# Patient Record
Sex: Male | Born: 1975 | Race: Black or African American | Hispanic: No | Marital: Single | Smoking: Former smoker
Health system: Southern US, Community
[De-identification: ages and names within clinical notes are randomized; demographics above are authoritative.]

## PROBLEM LIST (undated history)

## (undated) DIAGNOSIS — B9681 Helicobacter pylori [H. pylori] as the cause of diseases classified elsewhere: Secondary | ICD-10-CM

## (undated) DIAGNOSIS — K269 Duodenal ulcer, unspecified as acute or chronic, without hemorrhage or perforation: Secondary | ICD-10-CM

## (undated) DIAGNOSIS — D573 Sickle-cell trait: Secondary | ICD-10-CM

## (undated) HISTORY — DX: Duodenal ulcer, unspecified as acute or chronic, without hemorrhage or perforation: K26.9

## (undated) HISTORY — DX: Helicobacter pylori (H. pylori) as the cause of diseases classified elsewhere: B96.81

## (undated) HISTORY — PX: NO PAST SURGERIES: SHX2092

---

## 1999-10-02 ENCOUNTER — Emergency Department (HOSPITAL_COMMUNITY): Admission: EM | Admit: 1999-10-02 | Discharge: 1999-10-02 | Payer: Self-pay | Admitting: Emergency Medicine

## 2009-07-10 ENCOUNTER — Emergency Department (HOSPITAL_COMMUNITY): Admission: EM | Admit: 2009-07-10 | Discharge: 2009-07-10 | Payer: Self-pay | Admitting: Family Medicine

## 2011-03-29 ENCOUNTER — Emergency Department (HOSPITAL_COMMUNITY): Payer: Self-pay

## 2011-03-29 ENCOUNTER — Encounter (HOSPITAL_COMMUNITY): Payer: Self-pay | Admitting: Emergency Medicine

## 2011-03-29 ENCOUNTER — Inpatient Hospital Stay (HOSPITAL_COMMUNITY)
Admission: EM | Admit: 2011-03-29 | Discharge: 2011-04-02 | DRG: 379 | Disposition: A | Discharge status: Home / Self Care | Payer: Self-pay | Attending: Internal Medicine | Admitting: Internal Medicine

## 2011-03-29 DIAGNOSIS — K269 Duodenal ulcer, unspecified as acute or chronic, without hemorrhage or perforation: Secondary | ICD-10-CM

## 2011-03-29 DIAGNOSIS — K922 Gastrointestinal hemorrhage, unspecified: Secondary | ICD-10-CM | POA: Diagnosis present

## 2011-03-29 DIAGNOSIS — A048 Other specified bacterial intestinal infections: Secondary | ICD-10-CM | POA: Diagnosis present

## 2011-03-29 DIAGNOSIS — D72829 Elevated white blood cell count, unspecified: Secondary | ICD-10-CM | POA: Diagnosis present

## 2011-03-29 DIAGNOSIS — D500 Iron deficiency anemia secondary to blood loss (chronic): Secondary | ICD-10-CM | POA: Diagnosis present

## 2011-03-29 DIAGNOSIS — D649 Anemia, unspecified: Secondary | ICD-10-CM

## 2011-03-29 DIAGNOSIS — F17200 Nicotine dependence, unspecified, uncomplicated: Secondary | ICD-10-CM | POA: Diagnosis present

## 2011-03-29 DIAGNOSIS — K264 Chronic or unspecified duodenal ulcer with hemorrhage: Principal | ICD-10-CM | POA: Diagnosis present

## 2011-03-29 HISTORY — DX: Sickle-cell trait: D57.3

## 2011-03-29 LAB — COMPREHENSIVE METABOLIC PANEL
ALT: 9 U/L (ref 0–53)
AST: 20 U/L (ref 0–37)
Albumin: 3.5 g/dL (ref 3.5–5.2)
Alkaline Phosphatase: 43 U/L (ref 39–117)
CO2: 25 mEq/L (ref 19–32)
GFR calc non Af Amer: 86 mL/min — ABNORMAL LOW (ref >90)
Glucose, Bld: 111 mg/dL — ABNORMAL HIGH (ref 70–99)
Sodium: 139 mEq/L (ref 135–145)

## 2011-03-29 LAB — URINALYSIS, ROUTINE W REFLEX MICROSCOPIC
Glucose, UA: NEGATIVE mg/dL
Hgb urine dipstick: NEGATIVE
Leukocytes, UA: NEGATIVE
pH: 5.5 (ref 5.0–8.0)

## 2011-03-29 LAB — PROTIME-INR
INR: 1.1 (ref 0.00–1.49)
Prothrombin Time: 14.4 seconds (ref 11.6–15.2)

## 2011-03-29 LAB — DIFFERENTIAL
Basophils Absolute: 0 10*3/uL (ref 0.0–0.1)
Neutrophils Relative %: 79 % — ABNORMAL HIGH (ref 43–77)

## 2011-03-29 LAB — CBC
Hemoglobin: 10 g/dL — ABNORMAL LOW (ref 13.0–17.0)
MCH: 28.5 pg (ref 26.0–34.0)
MCHC: 36.9 g/dL — ABNORMAL HIGH (ref 30.0–36.0)
MCV: 77.2 fL — ABNORMAL LOW (ref 78.0–100.0)
WBC: 17.1 10*3/uL — ABNORMAL HIGH (ref 4.0–10.5)

## 2011-03-29 MED ORDER — IOHEXOL 300 MG/ML  SOLN
100.0000 mL | Freq: Once | INTRAMUSCULAR | Status: AC | PRN
  Administered 2011-03-29: 80 mL via INTRAVENOUS

## 2011-03-29 MED ORDER — SODIUM CHLORIDE 0.9 % IV BOLUS (SEPSIS)
1000.0000 mL | Freq: Once | INTRAVENOUS | Status: AC
  Administered 2011-03-29: 1000 mL via INTRAVENOUS

## 2011-03-29 MED ORDER — SODIUM CHLORIDE 0.9 % IV SOLN
8.0000 mg/h | INTRAVENOUS | Status: DC
  Administered 2011-03-29 – 2011-04-01 (×4): 8 mg/h via INTRAVENOUS
  Filled 2011-03-29 (×12): qty 80

## 2011-03-29 MED ORDER — SODIUM CHLORIDE 0.9 % IV SOLN
80.0000 mg | Freq: Once | INTRAVENOUS | Status: AC
  Administered 2011-03-29: 80 mg via INTRAVENOUS
  Filled 2011-03-29: qty 80

## 2011-03-29 NOTE — ED Provider Notes (Signed)
History     CSN: [id number]  Arrival date & time 03/29/11  1816   First MD Initiated Contact with Patient 03/29/11 1940      Chief Complaint  Patient presents with  . Rectal Bleeding    (Consider location/radiation/quality/duration/timing/severity/associated sxs/prior treatment) Patient is a 36 y.o. male presenting with hematochezia. The history is provided by the patient. No language interpreter was used.  Rectal Bleeding  The current episode started 2 days ago. The onset was gradual. The problem occurs continuously. The problem has been gradually worsening. The pain is moderate. Stool description: melanotic. There was no prior successful therapy. There was no prior unsuccessful therapy. Associated symptoms include abdominal pain and hematemesis. Pertinent negatives include no anorexia, no fever, no diarrhea, no nausea, no vomiting, no chest pain, no headaches and no coughing. The vomiting occurs intermittently. The emesis has an appearance of bright red blood. The vomiting is not associated with pain.    History reviewed. No pertinent past medical history.  History reviewed. No pertinent past surgical history.  History reviewed. No pertinent family history.  History  Substance Use Topics  . Smoking status: Never Smoker   . Smokeless tobacco: Not on file  . Alcohol Use: No      Review of Systems  Constitutional: Negative for fever, activity change, appetite change and fatigue.  HENT: Negative for congestion, sore throat, rhinorrhea, neck pain and neck stiffness.   Respiratory: Negative for cough and shortness of breath.   Cardiovascular: Negative for chest pain and palpitations.  Gastrointestinal: Positive for abdominal pain, blood in stool, hematochezia and hematemesis. Negative for nausea, vomiting, diarrhea, constipation and anorexia.  Genitourinary: Negative for dysuria, urgency, frequency and flank pain.  Musculoskeletal: Negative for myalgias, back pain and  arthralgias.  Neurological: Negative for dizziness, weakness, light-headedness, numbness and headaches.  All other systems reviewed and are negative.    Allergies  Review of patient's allergies indicates no known allergies.  Home Medications  No current outpatient prescriptions on file.  BP 116/70  Pulse 92  Temp(Src) 98.3 F (36.8 C) (Oral)  Resp 16  SpO2 100%  Physical Exam  Nursing note and vitals reviewed. Constitutional: He is oriented to person, place, and time. He appears well-developed and well-nourished. No distress.  HENT:  Head: Normocephalic and atraumatic.  Mouth/Throat: Oropharynx is clear and moist. No oropharyngeal exudate.  Eyes: Conjunctivae and EOM are normal. Pupils are equal, round, and reactive to light.  Neck: Normal range of motion. Neck supple.  Cardiovascular: Normal rate, regular rhythm, normal heart sounds and intact distal pulses.  Exam reveals no gallop and no friction rub.   No murmur heard. Pulmonary/Chest: Effort normal and breath sounds normal. No respiratory distress.  Abdominal: Soft. Bowel sounds are normal. There is no tenderness.  Genitourinary: Guaiac positive stool.  Musculoskeletal: Normal range of motion. He exhibits no tenderness.  Neurological: He is alert and oriented to person, place, and time. No cranial nerve deficit.  Skin: Skin is warm and dry. No rash noted.    ED Course  Procedures (including critical care time)  Labs Reviewed  CBC - Abnormal; Notable for the following:    WBC 17.1 (*) REPEATED TO VERIFY   RBC 3.51 (*)    Hemoglobin 10.0 (*)    HCT 27.1 (*)    MCV 77.2 (*)    MCHC 36.9 (*)    All other components within normal limits  DIFFERENTIAL - Abnormal; Notable for the following:    Neutrophils Relative 79 (*)  Neutro Abs 13.4 (*)    All other components within normal limits  COMPREHENSIVE METABOLIC PANEL - Abnormal; Notable for the following:    Glucose, Bld 111 (*)    BUN 27 (*)    Total Bilirubin  0.2 (*)    GFR calc non Af Amer 86 (*)    All other components within normal limits  URINALYSIS, ROUTINE W REFLEX MICROSCOPIC - Abnormal; Notable for the following:    Ketones, ur TRACE (*)    All other components within normal limits  APTT  PROTIME-INR   Ct Abdomen Pelvis W Contrast  03/29/2011  *RADIOLOGY REPORT*  Clinical Data: Rectal bleeding.  CT ABDOMEN AND PELVIS WITH CONTRAST  Technique:  Multidetector CT imaging of the abdomen and pelvis was performed following the standard protocol during bolus administration of intravenous contrast.  Contrast: 80mL OMNIPAQUE IOHEXOL 300 MG/ML IV SOLN  Comparison: None.  Findings: Limited images through the lung bases demonstrate no significant appreciable abnormality. The heart size is within normal limits. No pleural or pericardial effusion.  Unremarkable liver, biliary system, spleen, pancreas, adrenal glands.  Symmetric renal enhancement.  No hydronephrosis or hydroureter.  No bowel obstruction.  No CT evidence for colitis.  Normal appendix.  No free intraperitoneal air or fluid.  No lymphadenopathy.  Thin-walled bladder.  Normal caliber vasculature.  No acute osseous abnormality identified.  IMPRESSION: No acute CT abnormality identified.  Original Report Authenticated By: Waneta Martins, M.D.     1. GI bleed       MDM  GI bleed likely upper. Placed on a Protonix drip. CT is negative. He does have a white count of 17. Patient is a hemoglobin of 10 therefore feel this is a rather significant GI bleed. He vomited blood as well. He will be admitted to the hospital for further evaluation and treatment. He'll require GI evaluation. Is on a telemetry service. Discussed with the [facility] hospitalist        Dayton Bailiff, MD 03/29/11 [phone]

## 2011-03-29 NOTE — ED Notes (Signed)
Pt. Not in pain now.  He reports sometimes he will have abdominal pain after eating.

## 2011-03-29 NOTE — ED Notes (Signed)
Pt. Had tarry stools three days ago, then developed had bloody emesis today.  Also has mild upper abdominal pain.

## 2011-03-30 ENCOUNTER — Encounter (HOSPITAL_COMMUNITY): Payer: Self-pay | Admitting: Internal Medicine

## 2011-03-30 ENCOUNTER — Other Ambulatory Visit: Payer: Self-pay | Admitting: Internal Medicine

## 2011-03-30 ENCOUNTER — Encounter (HOSPITAL_COMMUNITY)
Admission: EM | Disposition: A | Discharge status: Home / Self Care | Payer: Self-pay | Source: Home / Self Care | Attending: Internal Medicine

## 2011-03-30 DIAGNOSIS — K922 Gastrointestinal hemorrhage, unspecified: Secondary | ICD-10-CM

## 2011-03-30 DIAGNOSIS — D72829 Elevated white blood cell count, unspecified: Secondary | ICD-10-CM | POA: Diagnosis present

## 2011-03-30 DIAGNOSIS — D649 Anemia, unspecified: Secondary | ICD-10-CM

## 2011-03-30 DIAGNOSIS — K269 Duodenal ulcer, unspecified as acute or chronic, without hemorrhage or perforation: Secondary | ICD-10-CM

## 2011-03-30 HISTORY — PX: ESOPHAGOGASTRODUODENOSCOPY: SHX5428

## 2011-03-30 LAB — CBC
HCT: 23.1 % — ABNORMAL LOW (ref 39.0–52.0)
HCT: 22.7 % — ABNORMAL LOW (ref 39.0–52.0)
Hemoglobin: 8.6 g/dL — ABNORMAL LOW (ref 13.0–17.0)
Hemoglobin: 8.4 g/dL — ABNORMAL LOW (ref 13.0–17.0)
Hemoglobin: 8.1 g/dL — ABNORMAL LOW (ref 13.0–17.0)
MCH: 28.8 pg (ref 26.0–34.0)
MCH: 29.2 pg (ref 26.0–34.0)
MCHC: 37 g/dL — ABNORMAL HIGH (ref 30.0–36.0)
MCHC: 37.3 g/dL — ABNORMAL HIGH (ref 30.0–36.0)
MCV: 77.3 fL — ABNORMAL LOW (ref 78.0–100.0)
MCV: 77.5 fL — ABNORMAL LOW (ref 78.0–100.0)
MCV: 78.3 fL (ref 78.0–100.0)
Platelets: 231 10*3/uL (ref 150–400)
Platelets: 210 10*3/uL (ref 150–400)
RBC: 2.99 MIL/uL — ABNORMAL LOW (ref 4.22–5.81)
RBC: 2.77 MIL/uL — ABNORMAL LOW (ref 4.22–5.81)
RDW: 13.5 % (ref 11.5–15.5)
WBC: 10.1 10*3/uL (ref 4.0–10.5)

## 2011-03-30 LAB — DIFFERENTIAL
Basophils Absolute: 0.1 10*3/uL (ref 0.0–0.1)
Eosinophils Absolute: 0.2 10*3/uL (ref 0.0–0.7)
Lymphs Abs: 3.1 10*3/uL (ref 0.7–4.0)
Neutro Abs: 5.9 10*3/uL (ref 1.7–7.7)

## 2011-03-30 LAB — FERRITIN: Ferritin: 68 ng/mL (ref 22–322)

## 2011-03-30 LAB — RETICULOCYTES
RBC.: 2.99 MIL/uL — ABNORMAL LOW (ref 4.22–5.81)
Retic Count, Absolute: 119.6 10*3/uL (ref 19.0–186.0)
Retic Ct Pct: 4 % — ABNORMAL HIGH (ref 0.4–3.1)

## 2011-03-30 LAB — IRON AND TIBC: UIBC: 113 ug/dL — ABNORMAL LOW (ref 125–400)

## 2011-03-30 SURGERY — EGD (ESOPHAGOGASTRODUODENOSCOPY)
Anesthesia: Moderate Sedation

## 2011-03-30 MED ORDER — ACETAMINOPHEN 650 MG RE SUPP: 650.0000 mg | Freq: Four times a day (QID) | RECTAL | Status: DC | PRN

## 2011-03-30 MED ORDER — SODIUM CHLORIDE 0.9 % IV SOLN
INTRAVENOUS | Status: AC
  Administered 2011-03-30 (×2): via INTRAVENOUS

## 2011-03-30 MED ORDER — EPINEPHRINE HCL 0.1 MG/ML IJ SOLN
INTRAMUSCULAR | Status: AC
Start: 2011-03-30 — End: 2011-03-30
  Filled 2011-03-30: qty 10

## 2011-03-30 MED ORDER — ONDANSETRON HCL 4 MG PO TABS: 4.0000 mg | ORAL_TABLET | Freq: Four times a day (QID) | ORAL | Status: DC | PRN

## 2011-03-30 MED ORDER — SODIUM CHLORIDE 0.9 % IV BOLUS (SEPSIS)
500.0000 mL | Freq: Once | INTRAVENOUS | Status: AC
  Administered 2011-03-30: 500 mL via INTRAVENOUS

## 2011-03-30 MED ORDER — SODIUM CHLORIDE 0.9 % IJ SOLN
3.0000 mL | Freq: Two times a day (BID) | INTRAMUSCULAR | Status: DC
  Administered 2011-04-01 – 2011-04-02 (×2): 3 mL via INTRAVENOUS

## 2011-03-30 MED ORDER — MIDAZOLAM HCL 10 MG/2ML IJ SOLN
INTRAMUSCULAR | Status: DC | PRN
  Administered 2011-03-30 (×2): 2 mg via INTRAVENOUS
  Administered 2011-03-30 (×4): 1 mg via INTRAVENOUS
  Administered 2011-03-30: 2 mg via INTRAVENOUS

## 2011-03-30 MED ORDER — ONDANSETRON HCL 4 MG/2ML IJ SOLN
4.0000 mg | Freq: Three times a day (TID) | INTRAMUSCULAR | Status: DC | PRN
  Administered 2011-03-30: 4 mg via INTRAVENOUS
  Filled 2011-03-30: qty 2

## 2011-03-30 MED ORDER — FENTANYL NICU IV SYRINGE 50 MCG/ML
INJECTION | INTRAMUSCULAR | Status: DC | PRN
  Administered 2011-03-30 (×4): 25 ug via INTRAVENOUS

## 2011-03-30 MED ORDER — ONDANSETRON HCL 4 MG/2ML IJ SOLN: 4.0000 mg | Freq: Four times a day (QID) | INTRAMUSCULAR | Status: DC | PRN

## 2011-03-30 MED ORDER — SODIUM CHLORIDE 0.9 % IJ SOLN
INTRAMUSCULAR | Status: DC | PRN
  Administered 2011-03-30: 17:00:00

## 2011-03-30 MED ORDER — ACETAMINOPHEN 325 MG PO TABS
650.0000 mg | ORAL_TABLET | Freq: Four times a day (QID) | ORAL | Status: DC | PRN
  Administered 2011-03-31 – 2011-04-01 (×2): 650 mg via ORAL
  Filled 2011-03-30 (×2): qty 2

## 2011-03-30 MED ORDER — SODIUM CHLORIDE 0.9 % IV SOLN
INTRAVENOUS | Status: DC
Start: 2011-03-30 — End: 2011-04-01

## 2011-03-30 MED ORDER — SODIUM CHLORIDE 0.45 % IV SOLN: Freq: Once | INTRAVENOUS | Status: DC

## 2011-03-30 NOTE — Progress Notes (Signed)
Patient briefly seen and examined, chart reviewed while he was in the ED earlier. 36 y/o young man Admitted earlier this morning here with hematemesis and maroon-colored stools, with significant Hb drop and orthostatic symptoms. On PPI drip, given a fluid bolus by GI and will be taken to EGD later today. Will continue to follow.

## 2011-03-30 NOTE — Progress Notes (Signed)
ED CM noted pt without pcp and [place] self pay.  CM spoke with pt who confirms self pay, no pcp and living with his grandmother. Pt reports not having a personal telephone.  [facility]ed [facility] and [facility].  Pt voiced understanding Provided written information for resources and other [place] resources on housing, financial assistance, discounted medications, etc.  Pt reports not being on medicines or having a Insurance claims handler.

## 2011-03-30 NOTE — Op Note (Signed)
[facility] [address] [place], [place]  [address]  ENDOSCOPY PROCEDURE REPORT  PATIENT:  David Galloway, David Galloway  MR#:  [id number] BIRTHDATE:  [birth year], 35 yrs. old  GENDER:  male ENDOSCOPIST:  Carie Caddy. Ayren Zumbro, MD Referred by:  [facility] PROCEDURE DATE:  03/30/2011 PROCEDURE:  EGD for control of bleeding ASA CLASS:  Class I INDICATIONS:  melena, hematemesis, anemia MEDICATIONS:   Fentanyl 100 mcg IV, Versed 10 mg IV TOPICAL ANESTHETIC:  Cetacaine Spray  DESCRIPTION OF PROCEDURE:   After the risks benefits and alternatives of the procedure were thoroughly explained, informed consent was obtained.  The Pentax Gastroscope [id number] endoscope was introduced through the mouth and advanced to the second portion of the duodenum, without limitations.  The instrument was slowly withdrawn as the mucosa was fully examined. <<PROCEDUREIMAGES>>  Slightly irregular Z-line.  Otherwise normal esophagus.  Mild gastritis was found antrum.  Multiple biopsies were obtained and sent to pathology.  Two ulcers were found in the bulb of the duodenum.  One was cleaned based, the other with small visible vessel.   Epinephrine injection was performed at the ulcer edge with 2 cc of 1:10,000 epinephrine with success.  One endoscopic clip was placed at the visible vessel with success.  There was mild duodenitis in the bulb with the remaining examined portions of the duodenum being unremarkable. Retroflexed views revealed no abnormalities.    The scope was then withdrawn from the patient and the procedure completed.  COMPLICATIONS:  None  ENDOSCOPIC IMPRESSION: 1) Slightly Irregular Z-line 2) Otherwise normal esophagus 3) Mild gastritis in the antrum.  Biopsies performed to evaluate for H. Pylori 4) Ulcers, two, in the bulb of duodenum.  One with visible vessel, injected and clipped.  RECOMMENDATIONS: 1) Continue PPI infusion for now (given endotherapy performed) 2) Await pathology results.   Treat with triple therapy if positive for H. Pylori. 3) Avoid NSAIDs 4) Follow Hgb/HCT and transfuse if needed.  Carie Caddy. Marylon Verno, MD  CC:  The Patient  n. eSIGNED:   Carie Caddy. Jden Want at 03/30/2011 05:17 PM  Candie Echevaria, [phone]

## 2011-03-30 NOTE — Consult Note (Signed)
Patient seen, examined and I agree with the above documentation, including the assessment and plan. Acute GI bleed with anemia, in setting of known sickle cell trait Still with orthostatic symptoms. Plan IV bolus, 1-2 L for resuscitation EGD later today, on PPI gtt.

## 2011-03-30 NOTE — H&P (Signed)
PCP:   No primary provider on file.   Confirmed with pt, doesn't have PCP   Chief Complaint:  Dark marroon stools and then dark marroon vomit  HPI: 35yoM with no major medical history presents with several days of marroon stools and marroon  vomitus today and found to have acute anemia, leukocytosis -- suspicious for UGIB.   Pt is good historian, states he was in usual state of health until this past Friday when he had  a BM that he noted was very dark, described as dark burgundy, without pain, not completely  solid but not completely liquid either. No other symptoms at that time. He thought it was odd  but tried to wait it out. Saturday, he noted another of these BM's which was also dark  burgundy. Saturday he started having increased dizziness and lightheadedness when he stands up,  that improves if he keeps still by laying down. On Monday, while walking home and after having  eaten a buffalo wing, he got nauseous and vomited when he got home and noted his vomitus was  also dark burgundy, the same color as his BM's before (?). No LOC, no chest pain, SOB.   He also endorses occasional, off/on, 5 year history of abdominal pain, possibly in the  epigastrium but also in the lower quadrants and to the sides, but this history is a bit less  specific. He thought this pain was due to smoking, and he'll try to quit smoking which will  make it feel better. He also thinks this pain may get worse with spicy foods. He can get up to  2ppd. He doesn't drink heavily, states only x1/month. Denies daily NSAID's. Never had  EGD/colonoscopy.   In the ED pt vitals were stable except minimal hypoTN to 99/51 (while sleeping), rest of vitals  stable. Labs with renal fxn 27/1.08, normal LFT's. WBC was 17.1 with 79% neutros. Hct was 27.1  with MCV 77, plts 245. INR was 1.1. UA was negative. CT abd/pelvis with contrast was done,  which showed nothing. Heme occult was positive in the ED. Pt was given 1L NS  bolus, 80mg   Protonix bolus then 8mg /hr drip, zofran, NS infusion.   ROS as above, otherwise negative or unremarkable. No fevers, chills, sweats. No dysuria or  cough. No chest pain, SOB, LOC.   History reviewed. No pertinent past medical history.  He's healthy overall. Tobacco abuse.   History reviewed. No pertinent past surgical history.  Medications:  HOME MEDS: Confirmed, no daily meds.  Prior to Admission medications   Not on File    Allergies:  No Known Allergies  Social History: Lives with grandmother. Unloads trucks, Conservation officer, nature, works in [employer] at [employer]. Still quite active. Current smoker, off and on but overall can get up to 2ppd at times. Denies heavy or daily drinking, states only x1 per month. Denies other drugs.   Family History: Family History  Problem Relation Age of Onset  . Diabetes Father     Physical Exam: Filed Vitals:   03/29/11 1846 03/29/11 2005 03/29/11 2130 03/30/11 0231  BP: 111/67 113/62 116/70 99/51  Pulse: 92 89 92 75  Temp: 98.3 F (36.8 C)   98 F (36.7 C)  TempSrc: Oral   Oral  Resp:  16  16  SpO2: 100% 100% 100% 98%   Blood pressure 99/51, pulse 75, temperature 98 F (36.7 C), temperature source Oral, resp. rate 16, SpO2 98.00%.  Gen: Thin, young appearing M in ED stretcher,  easily awoken and relates history well, no  distress. Pleasant HEENT: Pupils round, reactive, EOMI, sclera/irises clear. Mouth moist, normal appearing.  Lungs: CTAB no w/c/r, good air movement, normal exam Heart: RRR, not tachycardic, no m/g, normal exam Abd: Soft, scaphoid, some mild guarding, with minimal facial grimacing and subjective TTP in  suprapubic area. Overall, benign exam, not rigid or peritoneal Extrem: Warm, perfusing well, palpable radials, no BLE edema Neuro: Alert, attentive, CN 2-12 intact, moves extremities on his own, normal exam overall,  grossly non-focal   Labs & Imaging Results for orders placed during the hospital encounter of  03/29/11 (from the past 48 hour(s))  CBC     Status: Abnormal   Collection Time   03/29/11  8:30 PM      Component Value Range Comment   WBC 17.1 (*) 4.0 - 10.5 (K/uL) REPEATED TO VERIFY   RBC 3.51 (*) 4.22 - 5.81 (MIL/uL)    Hemoglobin 10.0 (*) 13.0 - 17.0 (g/dL)    HCT 16.1 (*) 09.6 - 52.0 (%)    MCV 77.2 (*) 78.0 - 100.0 (fL)    MCH 28.5  26.0 - 34.0 (pg)    MCHC 36.9 (*) 30.0 - 36.0 (g/dL)    RDW 04.5  40.9 - 81.1 (%)    Platelets 245  150 - 400 (K/uL)   DIFFERENTIAL     Status: Abnormal   Collection Time   03/29/11  8:30 PM      Component Value Range Comment   Neutrophils Relative 79 (*) 43 - 77 (%)    Lymphocytes Relative 15  12 - 46 (%)    Monocytes Relative 5  3 - 12 (%)    Eosinophils Relative 1  0 - 5 (%)    Basophils Relative 0  0 - 1 (%)    Neutro Abs 13.4 (*) 1.7 - 7.7 (K/uL)    Lymphs Abs 2.6  0.7 - 4.0 (K/uL)    Monocytes Absolute 0.9  0.1 - 1.0 (K/uL)    Eosinophils Absolute 0.2  0.0 - 0.7 (K/uL)    Basophils Absolute 0.0  0.0 - 0.1 (K/uL)    RBC Morphology POLYCHROMASIA PRESENT     COMPREHENSIVE METABOLIC PANEL     Status: Abnormal   Collection Time   03/29/11  8:30 PM      Component Value Range Comment   Sodium 139  135 - 145 (mEq/L)    Potassium 4.4  3.5 - 5.1 (mEq/L)    Chloride 105  96 - 112 (mEq/L)    CO2 25  19 - 32 (mEq/L)    Glucose, Bld 111 (*) 70 - 99 (mg/dL)    BUN 27 (*) 6 - 23 (mg/dL)    Creatinine, Ser 9.14  0.50 - 1.35 (mg/dL)    Calcium 8.5  8.4 - 10.5 (mg/dL)    Total Protein 6.1  6.0 - 8.3 (g/dL)    Albumin 3.5  3.5 - 5.2 (g/dL)    AST 20  0 - 37 (U/L)    ALT 9  0 - 53 (U/L)    Alkaline Phosphatase 43  39 - 117 (U/L)    Total Bilirubin 0.2 (*) 0.3 - 1.2 (mg/dL)    GFR calc non Af Amer 86 (*) >90 (mL/min)    GFR calc Af Amer >90  >90 (mL/min)   APTT     Status: Normal   Collection Time   03/29/11  8:30 PM      Component Value Range Comment  aPTT 25  24 - 37 (seconds)   PROTIME-INR     Status: Normal   Collection Time   03/29/11  8:30 PM       Component Value Range Comment   Prothrombin Time 14.4  11.6 - 15.2 (seconds)    INR 1.10  0.00 - 1.49    URINALYSIS, ROUTINE W REFLEX MICROSCOPIC     Status: Abnormal   Collection Time   03/29/11  8:47 PM      Component Value Range Comment   Color, Urine YELLOW  YELLOW     APPearance CLEAR  CLEAR     Specific Gravity, Urine 1.025  1.005 - 1.030     pH 5.5  5.0 - 8.0     Glucose, UA NEGATIVE  NEGATIVE (mg/dL)    Hgb urine dipstick NEGATIVE  NEGATIVE     Bilirubin Urine NEGATIVE  NEGATIVE     Ketones, ur TRACE (*) NEGATIVE (mg/dL)    Protein, ur NEGATIVE  NEGATIVE (mg/dL)    Urobilinogen, UA 0.2  0.0 - 1.0 (mg/dL)    Nitrite NEGATIVE  NEGATIVE     Leukocytes, UA NEGATIVE  NEGATIVE  MICROSCOPIC NOT DONE ON URINES WITH NEGATIVE PROTEIN, BLOOD, LEUKOCYTES, NITRITE, OR GLUCOSE <1000 mg/dL.   Ct Abdomen Pelvis W Contrast  03/29/2011  *RADIOLOGY REPORT*  Clinical Data: Rectal bleeding.  CT ABDOMEN AND PELVIS WITH CONTRAST  Technique:  Multidetector CT imaging of the abdomen and pelvis was performed following the standard protocol during bolus administration of intravenous contrast.  Contrast: 80mL OMNIPAQUE IOHEXOL 300 MG/ML IV SOLN  Comparison: None.  Findings: Limited images through the lung bases demonstrate no significant appreciable abnormality. The heart size is within normal limits. No pleural or pericardial effusion.  Unremarkable liver, biliary system, spleen, pancreas, adrenal glands.  Symmetric renal enhancement.  No hydronephrosis or hydroureter.  No bowel obstruction.  No CT evidence for colitis.  Normal appendix.  No free intraperitoneal air or fluid.  No lymphadenopathy.  Thin-walled bladder.  Normal caliber vasculature.  No acute osseous abnormality identified.  IMPRESSION: No acute CT abnormality identified.  Original Report Authenticated By: Waneta Martins, M.D.    Impression Present on Admission:  .Upper GI bleeding .Leukocytosis  35yoM with no major medical history  presents with several days of marroon stools and marroon  vomitus today and found to have acute anemia, leukocytosis -- suspicious for UGIB.   1. Suspected upper GIB: no BRBPR to suggest lower; CTAP negative. Suspect he has had low grade  PUD for awhile now that has now come to a head with symptomatic anemia. Main risk factors is  heavy smoking. BUN elevation also consistent with UGIB.   - Overall looks stable for floor with telemetry monitoring.  - Needs GI consultation for consideration of EGD, keep NPO, IVF's, Hpylori Ab, continue  protonix drip for now. Orthostatics on admit.  - I discussed with pt risks/benefits of transfusion and will hold on transfusion for now in  favor of continued monitoring. I did offer him a transfusion given he's symptomatic on  standing, but he declined at present.  - Counseled re: smoking cessation.   2. Anemia: Obviously likely due to GIB, but given MCV 77 will also get iron panel.   3. Leukocytosis: Has negative UA. No CXR, but he doesn't have any clinical story for PNA, or  really any other infectious source by ROS. Therefore, overall suspect this is stress reaction  due to above and will just monitor.  Telemetry, [facility] team 2 Presumed full code   Other plans as per orders.  Atiana Levier 03/30/2011, 3:52 AM

## 2011-03-30 NOTE — Consult Note (Signed)
[facility] Consultation  Referring Provider: [facility] Primary Care Physician:  No primary provider on file. Primary Gastroenterologist:   none Reason for Consultation:  GI bleed  HPI: David Galloway is a 36 y.o. male who began having black stool 4 days ago. Last bowel movement was Sunday but then yesterday patient began vomiting dark red blood. No significant abdominal pain other than LUQ  "hunger type pain". No NSAID use. Doesn't take any meds at home. WBC 17 initially, now at 10.1. Hemoglobin 10.0 initially, now 8.6. MCV 77. Patient gives a history of sickle cell trait. Differential shows target cells, polychromasia. Contrast CTscan of abd / pelvis negative. He has a great appetite, never gains weight but walks a lot. No history of PUD. No gastric or colon cancer in family. No family history of liver disease. He drinks ETOH about every 3 months.  PMH:  Sickle cell trait.  History reviewed. No pertinent past surgical history.  Prior to Admission medications   Not on File    Current Facility-Administered Medications  Medication Dose Route Frequency Provider Last Rate Last Dose  . 0.9 %  sodium chloride infusion   Intravenous STAT Dayton Bailiff, MD 125 mL/hr at 03/30/11 0232    . 0.9 %  sodium chloride infusion   Intravenous Continuous Carlota Raspberry, MD      . iohexol (OMNIPAQUE) 300 MG/ML solution 100 mL  100 mL Intravenous Once PRN Medication Radiologist, MD   80 mL at 03/29/11 2308  . ondansetron (ZOFRAN) injection 4 mg  4 mg Intravenous Q8H PRN Dayton Bailiff, MD   4 mg at 03/30/11 0232  . pantoprazole (PROTONIX) 80 mg in sodium chloride 0.9 % 100 mL IVPB  80 mg Intravenous Once Dayton Bailiff, MD   80 mg at 03/29/11 2058  . pantoprazole (PROTONIX) 80 mg in sodium chloride 0.9 % 250 mL infusion  8 mg/hr Intravenous Continuous Dayton Bailiff, MD 25 mL/hr at 03/29/11 2146 8 mg/hr at 03/29/11 2146  . sodium chloride 0.9 % bolus 1,000 mL  1,000 mL Intravenous Once Dayton Bailiff, MD    1,000 mL at 03/29/11 2058   No current outpatient prescriptions on file.    Allergies as of 03/29/2011  . (No Known Allergies)    Family History  Problem Relation Age of Onset  . Diabetes Father     History   Social History  . Marital Status: Single    Spouse Name: N/A    Number of Children: N/A  . Years of Education: N/A   Occupational History  . Not on file.   Social History Main Topics  . Smoking status: Current Everyday Smoker -- 1.0 packs/day  . Smokeless tobacco: Not on file   Comment: Up to 2 ppd at times.   . Alcohol Use: Yes     Occasional only, only x1 per month  . Drug Use: No  . Sexually Active: Not on file    Social History Narrative   Lives with grandmother. Unloads trucks, Conservation officer, nature, works in [employer] at [employer]. Still quite active. Current smoker, off and on but overall can get up to 2ppd at times. Denies heavy or daily drinking, states only x1 per month. Denies other drugs.     Review of Systems: Positive for slight dizziness with standing. All other systems reviewed and negative except where noted in HPI.  PHYSICAL EXAM: Vital signs in last 24 hours: Temp:  [97.3 F (36.3 C)-98.3 F (36.8 C)] 97.3 F (36.3 C) (02/05 0507) Pulse  Rate:  [75-116] 78  (02/05 0507) Resp:  [16] 16  (02/05 0507) BP: (99-118)/(51-70) 103/63 mmHg (02/05 0507) SpO2:  [98 %-100 %] 99 % (02/05 0507)   General:   Thin black male in NAD Head:  Normocephalic and atraumatic. Eyes:   No icterus.   Conjunctiva pale. Ears:  Normal auditory acuity. Neck:  Supple; no masses felt Lungs:  Respirations even and unlabored. Lungs clear to auscultation bilaterally.   No wheezes, crackles, or rhonchi.  Heart:  Regular rate and rhythm. Abdomen:  Soft, nondistended, nontender. Normal bowel sounds. No appreciable masses or hepatomegaly.  Rectal:  Scant melenic stool in vault  Msk:  Symmetrical without gross deformities.  Extremities:  Without edema. Neurologic:  Alert and oriented,  grossly normal neurologically. Skin:  Intact without significant lesions or rashes. Cervical Nodes:  No significant cervical adenopathy. Psych:  Alert and cooperative. Normal affect.  LAB RESULTS:  Basename 03/30/11 0446 03/29/11 2030  WBC 10.1 17.1*  HGB 8.6* 10.0*  HCT 23.1* 27.1*  PLT 231 245   BMET  Basename 03/29/11 2030  NA 139  K 4.4  CL 105  CO2 25  GLUCOSE 111*  BUN 27*  CREATININE 1.08  CALCIUM 8.5   LFT  Basename 03/29/11 2030  PROT 6.1  ALBUMIN 3.5  AST 20  ALT 9  ALKPHOS 43  BILITOT 0.2*  BILIDIR --  IBILI --   PT/INR  Basename 03/29/11 2030  LABPROT 14.4  INR 1.10    STUDIES: Ct Abdomen Pelvis W Contrast  03/29/2011  *RADIOLOGY REPORT*  Clinical Data: Rectal bleeding.  CT ABDOMEN AND PELVIS WITH CONTRAST  Technique:  Multidetector CT imaging of the abdomen and pelvis was performed following the standard protocol during bolus administration of intravenous contrast.  Contrast: 80mL OMNIPAQUE IOHEXOL 300 MG/ML IV SOLN  Comparison: None.  Findings: Limited images through the lung bases demonstrate no significant appreciable abnormality. The heart size is within normal limits. No pleural or pericardial effusion.  Unremarkable liver, biliary system, spleen, pancreas, adrenal glands.  Symmetric renal enhancement.  No hydronephrosis or hydroureter.  No bowel obstruction.  No CT evidence for colitis.  Normal appendix.  No free intraperitoneal air or fluid.  No lymphadenopathy.  Thin-walled bladder.  Normal caliber vasculature.  No acute osseous abnormality identified.  IMPRESSION: No acute CT abnormality identified.  Original Report Authenticated By: Waneta Martins, M.D.     PREVIOUS ENDOSCOPIES: none  IMPRESSION / PLAN: 34. 36 year old black male with upper GI bleed evidenced by hematemesis and melena. Contrast CTscan of abdomen and pelvis unremarkable. Continue PPI drip, NPO. Will plan for EGD this afternoon. Patient is hemodynamically stable but feels  dizzy when standing. He needs to be on bedrest, will give him a 500cc fluid bolus now.  2. Anemia of acute blood loss. Hemoglobin 8.6, down from initial hemoglobin of 10.0 .  Baseline not known but patient does give a history of sickle cell trait  His MCV is low at 77, target cells and polychromasia on differential.     Thanks   LOS: 1 day   Willette Cluster  03/30/2011, 10:25 AM

## 2011-03-31 ENCOUNTER — Encounter (HOSPITAL_COMMUNITY): Payer: Self-pay | Admitting: Internal Medicine

## 2011-03-31 LAB — CBC
HCT: 20.3 % — ABNORMAL LOW (ref 39.0–52.0)
MCHC: 36.7 g/dL — ABNORMAL HIGH (ref 30.0–36.0)
MCV: 78.1 fL (ref 78.0–100.0)
Platelets: 185 10*3/uL (ref 150–400)
RDW: 13.8 % (ref 11.5–15.5)
WBC: 7.1 10*3/uL (ref 4.0–10.5)

## 2011-03-31 LAB — BASIC METABOLIC PANEL
Calcium: 7.7 mg/dL — ABNORMAL LOW (ref 8.4–10.5)
Creatinine, Ser: 1.05 mg/dL (ref 0.50–1.35)
GFR calc Af Amer: >90 mL/min (ref >90)
GFR calc non Af Amer: 89 mL/min — ABNORMAL LOW (ref >90)
Sodium: 140 mEq/L (ref 135–145)

## 2011-03-31 LAB — ABO/RH: ABO/RH(D): B POS

## 2011-03-31 LAB — PREPARE RBC (CROSSMATCH)

## 2011-03-31 MED FILL — Diphenhydramine HCl Inj 50 MG/ML: INTRAMUSCULAR | Qty: 1 | Status: AC

## 2011-03-31 NOTE — Progress Notes (Signed)
   CARE MANAGEMENT NOTE 03/31/2011  Patient:  David Galloway, David Galloway   Account Number:  [id number]  Date Initiated:  03/31/2011  Documentation initiated by:  Raiford Noble  Subjective/Objective Assessment:   pt adm with ugi bleed     Action/Plan:   dc home w/ grandmother   Anticipated DC Date:  04/02/2011   Anticipated DC Plan:  HOME/SELF CARE  In-house referral  Financial Counselor      DC Planning Services  CM consult  [facility] / [facility] (established/new)  [facility]      [facility] Choice  NA   Choice offered to / List presented to:  NA   DME arranged  NA      DME agency  NA     HH arranged  NA      HH agency  NA   Status of service:  In process, will continue to follow Medicare Important Message given?   (If response is "NO", the following Medicare IM given date fields will be blank) Date Medicare IM given:   Date Additional Medicare IM given:    Discharge Disposition:  HOME/SELF CARE  Per UR Regulation:  Reviewed for med. necessity/level of care/duration of stay  Comments:  03-31-11 Raiford Noble, RN,BSN,CM [phone] Pt has already been set up with [facility] ON 05/17/11 at 1030 am. Plans to dc home with grandmother upon dc.

## 2011-03-31 NOTE — Progress Notes (Signed)
Subjective: No new complaints.  Objective: Weight change:   Intake/Output Summary (Last 24 hours) at 03/31/11 1752 Last data filed at 03/31/11 0800  Gross per 24 hour  Intake   1200 ml  Output      0 ml  Net   1200 ml    Filed Vitals:   03/31/11 1356  BP: 112/64  Pulse: 84  Temp: 99.5 F (37.5 C)  Resp: 18   On Exam  He is alert low grade  Temp. Appears comfortable  CVS S1S2 heard LUngs clear Abdomen soft nt nd bowel sounds heard Extremities: no pedal edema.   Lab Results: Results for orders placed during the hospital encounter of 03/29/11 (from the past 24 hour(s))  CBC     Status: Abnormal   Collection Time   03/31/11 12:21 AM      Component Value Range   WBC 7.1  4.0 - 10.5 (K/uL)   RBC 2.60 (*) 4.22 - 5.81 (MIL/uL)   Hemoglobin 7.6 (*) 13.0 - 17.0 (g/dL)   HCT 16.1 (*) 09.6 - 52.0 (%)   MCV 78.1  78.0 - 100.0 (fL)   MCH 29.2  26.0 - 34.0 (pg)   MCHC 36.7 (*) 30.0 - 36.0 (g/dL)   RDW 04.5  40.9 - 81.1 (%)   Platelets 185  150 - 400 (K/uL)  BASIC METABOLIC PANEL     Status: Abnormal   Collection Time   03/31/11 12:21 AM      Component Value Range   Sodium 140  135 - 145 (mEq/L)   Potassium 3.6  3.5 - 5.1 (mEq/L)   Chloride 110  96 - 112 (mEq/L)   CO2 24  19 - 32 (mEq/L)   Glucose, Bld 96  70 - 99 (mg/dL)   BUN 12  6 - 23 (mg/dL)   Creatinine, Ser 9.14  0.50 - 1.35 (mg/dL)   Calcium 7.7 (*) 8.4 - 10.5 (mg/dL)   GFR calc non Af Amer 89 (*) >90 (mL/min)   GFR calc Af Amer >90  >90 (mL/min)  PREPARE RBC (CROSSMATCH)     Status: Normal   Collection Time   03/31/11  1:00 PM      Component Value Range   Order Confirmation ORDER PROCESSED BY BLOOD BANK    ABO/RH     Status: Normal   Collection Time   03/31/11  3:30 PM      Component Value Range   ABO/RH(D) B POS    TYPE AND SCREEN     Status: Normal (Preliminary result)   Collection Time   03/31/11  3:31 PM      Component Value Range   ABO/RH(D) B POS     Antibody Screen NEG     Sample Expiration 04/03/2011      Unit Number [id number]     Blood Component Type RED CELLS,LR     Unit division 00     Status of Unit ALLOCATED     Transfusion Status OK TO TRANSFUSE     Crossmatch Result Compatible       Micro Results: No results found for this or any previous visit (from the past 240 hour(s)).  Studies/Results: Ct Abdomen Pelvis W Contrast  03/29/2011  *RADIOLOGY REPORT*  Clinical Data: Rectal bleeding.  CT ABDOMEN AND PELVIS WITH CONTRAST  Technique:  Multidetector CT imaging of the abdomen and pelvis was performed following the standard protocol during bolus administration of intravenous contrast.  Contrast: 80mL OMNIPAQUE IOHEXOL 300 MG/ML IV SOLN  Comparison: None.  Findings: Limited images through the lung bases demonstrate no significant appreciable abnormality. The heart size is within normal limits. No pleural or pericardial effusion.  Unremarkable liver, biliary system, spleen, pancreas, adrenal glands.  Symmetric renal enhancement.  No hydronephrosis or hydroureter.  No bowel obstruction.  No CT evidence for colitis.  Normal appendix.  No free intraperitoneal air or fluid.  No lymphadenopathy.  Thin-walled bladder.  Normal caliber vasculature.  No acute osseous abnormality identified.  IMPRESSION: No acute CT abnormality identified.  Original Report Authenticated By: Waneta Martins, M.D.   Medications: Scheduled Meds:   . sodium chloride   Intravenous Once  . sodium chloride   Intravenous STAT  . sodium chloride  3 mL Intravenous Q12H   Continuous Infusions:   . sodium chloride    . pantoprozole (PROTONIX) infusion 8 mg/hr (03/31/11 0630)   PRN Meds:.acetaminophen, acetaminophen, ondansetron (ZOFRAN) IV, ondansetron  Assessment/Plan: Patient Active Hospital Problem List: Upper GI bleed: secondary to duodenal ulcers. On PPI drip. Appreciate GI recommendations.  S/p 1 unit prbc transfusion. Will repeat H&H in am. Continue with clear liquid diet.   Anemia: secondary to GIB. S/p 1  unit prbc transfusion. Continue to monitor H&H.  Leukocytosis: resolved.       LOS: 2 days   Khaleesi Gruel 03/31/2011, 5:52 PM

## 2011-03-31 NOTE — Progress Notes (Signed)
I agree with the above documentation, including the assessment and plan. Likely equilibration of Hgb without further melena or evidence of ongoing bleeding PPI gtt Awaiting results of H Pylori bx Agree with 1 u PRBC given symptoms

## 2011-03-31 NOTE — Progress Notes (Signed)
Pre-transfusion vitals taken. Pt had oral temp of 100.7. Night coverage made aware. Orders received. Tylenol administered and will continue to monitor vital signs.

## 2011-03-31 NOTE — Progress Notes (Signed)
UR completed 

## 2011-03-31 NOTE — Progress Notes (Signed)
[facility] Progress Note  SUBJECTIVE: feels okay except for mild dizziness. Ate grits for breakfast.  OBJECTIVE:  Vital signs in last 24 hours: Temp:  [97.6 F (36.4 C)-99 F (37.2 C)] 99 F (37.2 C) (02/06 0550) Pulse Rate:  [71-98] 72  (02/06 0550) Resp:  [9-80] 18  (02/06 0550) BP: (101-122)/(40-74) 109/40 mmHg (02/06 0550) SpO2:  [94 %-100 %] 99 % (02/06 0550) Weight:  [65.772 kg (145 lb)] 65.772 kg (145 lb) (02/05 1437) Last BM Date: 03/30/11 General:    Black male in NAD. Brother visiting Abdomen:  Soft, nontender and nondistended. Normal bowel sounds. Neurologic:  Alert and oriented,  grossly normal neurologically. Psych:  Cooperative. Normal mood and affect.   Lab Results:  Basename 03/31/11 0021 03/30/11 1747 03/30/11 0935  WBC 7.1 7.0 8.5  HGB 7.6* 8.1* 8.4*  HCT 20.3* 21.7* 22.7*  PLT 185 210 203   BMET  Basename 03/31/11 0021 03/29/11 2030  NA 140 139  K 3.6 4.4  CL 110 105  CO2 24 25  GLUCOSE 96 111*  BUN 12 27*  CREATININE 1.05 1.08  CALCIUM 7.7* 8.5   LFT  Basename 03/29/11 2030  PROT 6.1  ALBUMIN 3.5  AST 20  ALT 9  ALKPHOS 43  BILITOT 0.2*  BILIDIR --  IBILI --   PT/INR  Basename 03/29/11 2030  LABPROT 14.4  INR 1.10   ASSSESSMENT / PLAN:  8. 36 year old black male with upper GI bleed secondary to duodenal ulcers, one with visible vessel. He is s/p epinephrine injection and endoclip. No further bleeding. Awaiting H.Pylori results. Continue PPI drip for total of 72 hours then change to PO BID.  2. Symptomatic anemia of acute blood loss. Patient complains of dizziness.  Hemoglobin 7.6, down from initial hemoglobin of 10.0 . Baseline not known but patient does give a history of sickle cell trait His MCV is low at 77, target cells and polychromasia on differential. Will give him one unit of blood for symptomatic anemia.   LOS: 2 days   Willette Cluster  03/31/2011, 9:06 AM

## 2011-04-01 LAB — CBC
MCHC: 35.9 g/dL (ref 30.0–36.0)
Platelets: 204 10*3/uL (ref 150–400)
RDW: 13.9 % (ref 11.5–15.5)
WBC: 7.1 10*3/uL (ref 4.0–10.5)

## 2011-04-01 MED ORDER — PANTOPRAZOLE SODIUM 40 MG PO TBEC
40.0000 mg | DELAYED_RELEASE_TABLET | Freq: Two times a day (BID) | ORAL | Status: DC
  Administered 2011-04-01 – 2011-04-02 (×3): 40 mg via ORAL
  Filled 2011-04-01 (×7): qty 1

## 2011-04-01 NOTE — Progress Notes (Signed)
[facility] Progress Note  Subjective: No new complaints.  Mild low grade fever overnight, after blood transfusion.  Treated with APAP. No BM since admission.  No abd pain.  No nausea. Tolerating diet.  Objective:  Vital signs in last 24 hours: Temp:  [98.5 F (36.9 C)-100.7 F (38.2 C)] 98.6 F (37 C) (02/07 0515) Pulse Rate:  [67-91] 75  (02/07 0515) Resp:  [16-20] 16  (02/07 0515) BP: (102-119)/(64-70) 102/68 mmHg (02/07 0515) SpO2:  [96 %-100 %] 96 % (02/07 0515) Last BM Date: 03/30/11 General:   Alert,  Well-developed, NAD Heart:  Regular rate and rhythm; no murmurs Chest: CTA b/l Abdomen:  Soft, nontender and nondistended. Normal bowel sounds, without guarding, and without rebound.   Extremities:  Without edema. Neurologic:  Alert and  oriented x4;  grossly normal neurologically. Psych:  Alert and cooperative. Normal mood and affect.  Lab Results:  Basename 03/31/11 0021 03/30/11 1747 03/30/11 0935  WBC 7.1 7.0 8.5  HGB 7.6* 8.1* 8.4*  HCT 20.3* 21.7* 22.7*  PLT 185 210 203   BMET  Basename 03/31/11 0021 03/29/11 2030  NA 140 139  K 3.6 4.4  CL 110 105  CO2 24 25  GLUCOSE 96 111*  BUN 12 27*  CREATININE 1.05 1.08  CALCIUM 7.7* 8.5   LFT  Basename 03/29/11 2030  PROT 6.1  ALBUMIN 3.5  AST 20  ALT 9  ALKPHOS 43  BILITOT 0.2*  BILIDIR --  IBILI --   PT/INR  Basename 03/29/11 2030  LABPROT 14.4  INR 1.10   Assessment / Plan: 36 yo admitted with UGI bleeding secondary to duodenal ulcers.  1. Duodenal ulcers -- no evidence of further bleeding s/p epi injection and clip x 1.  Pt was having orthostatic symptoms yesterday and received 1 unit of pRBC. Reports this helped his overall symptoms.  Awaiting path, as strong suspicion these ulcers are h. Pylori related.   --switch to BID PPI for at least 2 months. --follow-up path --awaiting CBC from today --advance diet  If CBC improved and no further bleeding, likely can go home soon.    LOS: 3 days   PYRTLE, JAY M  04/01/2011, 9:16 AM

## 2011-04-01 NOTE — Progress Notes (Signed)
Subjective:  no new complaints.   Objective: Weight change:   Intake/Output Summary (Last 24 hours) at 04/01/11 1158 Last data filed at 04/01/11 1100  Gross per 24 hour  Intake   1920 ml  Output   1750 ml  Net    170 ml    Filed Vitals:   04/01/11 0515  BP: 102/68  Pulse: 75  Temp: 98.6 F (37 C)  Resp: 16   On Exam  He is alert afebrile, comfortable  CVS S1S2 heard  LUngs clear  Abdomen soft nt nd bowel sounds heard  Extremities: no pedal edema.    Lab Results: Results for orders placed during the hospital encounter of 03/29/11 (from the past 24 hour(s))  PREPARE RBC (CROSSMATCH)     Status: Normal   Collection Time   03/31/11  1:00 PM      Component Value Range   Order Confirmation ORDER PROCESSED BY BLOOD BANK    ABO/RH     Status: Normal   Collection Time   03/31/11  3:30 PM      Component Value Range   ABO/RH(D) B POS    TYPE AND SCREEN     Status: Normal (Preliminary result)   Collection Time   03/31/11  3:31 PM      Component Value Range   ABO/RH(D) B POS     Antibody Screen NEG     Sample Expiration 04/03/2011     Unit Number [id number]     Blood Component Type RED CELLS,LR     Unit division 00     Status of Unit ISSUED     Transfusion Status OK TO TRANSFUSE     Crossmatch Result Compatible    CBC     Status: Abnormal   Collection Time   04/01/11  9:20 AM      Component Value Range   WBC 7.1  4.0 - 10.5 (K/uL)   RBC 2.98 (*) 4.22 - 5.81 (MIL/uL)   Hemoglobin 8.5 (*) 13.0 - 17.0 (g/dL)   HCT 78.2 (*) 95.6 - 52.0 (%)   MCV 79.5  78.0 - 100.0 (fL)   MCH 28.5  26.0 - 34.0 (pg)   MCHC 35.9  30.0 - 36.0 (g/dL)   RDW 21.3  08.6 - 57.8 (%)   Platelets 204  150 - 400 (K/uL)     Micro Results: No results found for this or any previous visit (from the past 240 hour(s)).  Studies/Results: Ct Abdomen Pelvis W Contrast  03/29/2011  *RADIOLOGY REPORT*  Clinical Data: Rectal bleeding.  CT ABDOMEN AND PELVIS WITH CONTRAST  Technique:  Multidetector CT imaging  of the abdomen and pelvis was performed following the standard protocol during bolus administration of intravenous contrast.  Contrast: 80mL OMNIPAQUE IOHEXOL 300 MG/ML IV SOLN  Comparison: None.  Findings: Limited images through the lung bases demonstrate no significant appreciable abnormality. The heart size is within normal limits. No pleural or pericardial effusion.  Unremarkable liver, biliary system, spleen, pancreas, adrenal glands.  Symmetric renal enhancement.  No hydronephrosis or hydroureter.  No bowel obstruction.  No CT evidence for colitis.  Normal appendix.  No free intraperitoneal air or fluid.  No lymphadenopathy.  Thin-walled bladder.  Normal caliber vasculature.  No acute osseous abnormality identified.  IMPRESSION: No acute CT abnormality identified.  Original Report Authenticated By: Waneta Martins, M.D.   Medications: Scheduled Meds:   . sodium chloride   Intravenous Once  . pantoprazole  40 mg Oral BID AC  .  sodium chloride  3 mL Intravenous Q12H   Continuous Infusions:   . DISCONTD: sodium chloride    . DISCONTD: pantoprozole (PROTONIX) infusion 8 mg/hr (04/01/11 0517)   PRN Meds:.acetaminophen, acetaminophen, ondansetron (ZOFRAN) IV, ondansetron  Assessment/Plan: Patient Active Hospital Problem List: Upper GI bleeding (03/30/2011)  secondary to duodenal ulcers. On po protonix bid for 2 months. S/p 1 unit for blood transfusion. H&H improved from yesterday. Awaiting path results. On regular diet. Will d/c home if no fever and no drop in hgb in the next 24 hours and if he is able to tolerate a regular diet.  Leukocytosis (03/30/2011): resolved.  Fever: no leukocytosis.  no chest pain, sob, abd pain, nausea, or cough, or diarrhea. Fever before the blood transfusion. Ordered 1 set of blood cultures.   Anemia   secondary to GIB. S/p 1 unit prbc transfusion. Continue to monitor H&H.   Duodenal ulcer   as per #1   LOS: 3 days   David Galloway 04/01/2011, 11:58 AM

## 2011-04-02 ENCOUNTER — Encounter: Payer: Self-pay | Admitting: Internal Medicine

## 2011-04-02 LAB — TYPE AND SCREEN

## 2011-04-02 LAB — CBC
HCT: 25 % — ABNORMAL LOW (ref 39.0–52.0)
Hemoglobin: 9.2 g/dL — ABNORMAL LOW (ref 13.0–17.0)
RDW: 14.4 % (ref 11.5–15.5)
WBC: 7.7 10*3/uL (ref 4.0–10.5)

## 2011-04-02 MED ORDER — AMOXICILLIN 500 MG PO CAPS
1000.0000 mg | ORAL_CAPSULE | Freq: Two times a day (BID) | ORAL | Status: DC
  Administered 2011-04-02: 1000 mg via ORAL
  Filled 2011-04-02 (×4): qty 2

## 2011-04-02 MED ORDER — ONDANSETRON HCL 4 MG PO TABS: 4.0000 mg | ORAL_TABLET | Freq: Four times a day (QID) | ORAL | Status: AC | PRN

## 2011-04-02 MED ORDER — CLARITHROMYCIN 500 MG PO TABS
500.0000 mg | ORAL_TABLET | Freq: Two times a day (BID) | ORAL | Status: DC
  Administered 2011-04-02: 500 mg via ORAL
  Filled 2011-04-02 (×4): qty 1

## 2011-04-02 MED ORDER — PANTOPRAZOLE SODIUM 40 MG PO TBEC: 40.0000 mg | DELAYED_RELEASE_TABLET | Freq: Two times a day (BID) | ORAL | Status: DC

## 2011-04-02 MED ORDER — CLARITHROMYCIN 500 MG PO TABS: 500.0000 mg | ORAL_TABLET | Freq: Two times a day (BID) | ORAL | Status: AC

## 2011-04-02 MED ORDER — AMOXICILLIN 500 MG PO CAPS: 1000.0000 mg | ORAL_CAPSULE | Freq: Two times a day (BID) | ORAL | Status: AC

## 2011-04-02 NOTE — Progress Notes (Signed)
I agree with the above documentation, including the assessment and plan. H. Pylori duodenal ulcers 10 days of triple therapy for HP 8 weeks of BID PPI Avoid NSAIDs GI followup made Call with questions.

## 2011-04-02 NOTE — Progress Notes (Signed)
Voices understanding of d/c instructions.  No changes noted since am assessment.   

## 2011-04-02 NOTE — Progress Notes (Signed)
[facility] Progress Note  SUBJECTIVE: feels okay, no further bleeding.   OBJECTIVE:  Vital signs in last 24 hours: Temp:  [98.3 F (36.8 C)-98.6 F (37 C)] 98.6 F (37 C) (02/08 0602) Pulse Rate:  [57-73] 57  (02/08 0602) Resp:  [16-18] 16  (02/08 0602) BP: (106-113)/(66-73) 106/70 mmHg (02/08 0602) SpO2:  [96 %-99 %] 96 % (02/08 0602) Weight:  [65.772 kg (145 lb)] 65.772 kg (145 lb) (02/07 1440) Last BM Date: 03/30/11 General:    Pleasant black male in NAD Heart:  Regular rate and rhythm Abdomen:  Soft, nontender and nondistended. Normal bowel sounds. Neurologic:  Alert and oriented,  grossly normal neurologically. Psych:  Cooperative. Normal mood and affect.  Lab Results:  Basename 04/02/11 0510 04/01/11 0920 03/31/11 0021  WBC 7.7 7.1 7.1  HGB 9.2* 8.5* 7.6*  HCT 25.0* 23.7* 20.3*  PLT 217 204 185   BMET  Basename 03/31/11 0021  NA 140  K 3.6  CL 110  CO2 24  GLUCOSE 96  BUN 12  CREATININE 1.05  CALCIUM 7.7*    ASSESSMENT / PLAN:  1. UGIB secondary to duodenal ulcers --s/p epi injection and clip x 1. No further bleeding. Path positive for H.Pylori, antibiotics started, needs 10 days of therapy. Continue BID PPI for 2 months. Follow up appointment made with [place] for 04/23/11 at 9:30am.  2. Anemia of acute blood loss, s/p one unit of blood yesterday for symptomatic anemia. Hemoglobin up from 7.6 to 9.2.     LOS: 4 days   Willette Cluster  04/02/2011, 10:59 AM

## 2011-04-08 LAB — CULTURE, BLOOD (ROUTINE X 2)
Culture  Setup Time: 201302080035
Culture: NO GROWTH

## 2011-04-14 NOTE — Discharge Summary (Signed)
DISCHARGE SUMMARY  David Galloway  MR#: [id number]  DOB:1976/02/08  Date of Admission: 03/29/2011 Date of Discharge: 04/14/2011  Attending Physician:Arisbeth Purrington  Patient's PCP:No primary provider on file.  Consults: - gastroenterology consult   Discharge Diagnoses: Present on Admission:  .Upper GI bleeding .Leukocytosis H pylori infection.   Brief Admit Note. 35yoM with no major medical history presents with several days of marroon stools and marroon  vomitus today and found to have acute anemia, leukocytosis -- suspicious for UGIB. He is admitted to medical service for evalution of gi bleed.    Hospital Course:  Upper GI bleeding (03/30/2011) secondary to duodenal ulcers, which were evident on EGD . On po protonix bid for 2 months. S/p 1 unit for blood transfusion. H&H improved from yesterday. Path results showed H pylori, and he is started on 10 days of tripel therapy for HP AND 8 weeks of ppi . On regular diet. Follow up with [facility] as outpatient as recommended.  Leukocytosis (03/30/2011): resolved.    Anemia  secondary to GIB. S/p 1 unit prbc transfusion. H&H stable.   Duodenal ulcer  as per #1   Day of Discharge BP 127/78  Pulse 87  Temp(Src) 98.7 F (37.1 C) (Oral)  Resp 17  Ht 5\' 11"  (1.803 m)  Wt 67.2 kg (148 lb 2.4 oz)  BMI 20.66 kg/m2  SpO2 100%  Physical Exam: On Exam  He is alert afebrile, comfortable  CVS S1S2 heard  LUngs clear  Abdomen soft nt nd bowel sounds heard  Extremities: no pedal edema   No results found for this or any previous visit (from the past 24 hour(s)).  Disposition: Home   Follow-up Appts: Discharge Orders    Future Appointments: Provider: Department: Dept Phone: Center:   04/23/2011 9:30 AM Erick Blinks, MD [facility] [phone] [facility]     Future Orders Please Complete By Expires   Diet general      Discharge instructions      Comments:   Follow up with GI as recommended.   Activity as tolerated - No  restrictions         Follow-up Information    Follow up with [facility] on 05/17/2011. (one time hospital follow up appointment with Dr Sherron Flemings at 1030 )    Contact information:   [address] [place]      Follow up with [facility] on 04/19/2011. ([facility] eliibility appt Monday Apr 19, 2011 at 3:30 pm )    Contact information:   [address] [place]      Follow up with [facility] on 04/23/2011. (at 9:30 am)    Contact information:   [address] [place] [phone]         Tests Needing Follow-up: Cbc in one week.   Time spent in discharge (includes decision making & examination of pt): 50 minutes  Signed: Deby Adger 04/14/2011, 12:13 AM

## 2011-04-22 ENCOUNTER — Encounter: Payer: Self-pay | Admitting: Internal Medicine

## 2011-04-23 ENCOUNTER — Encounter: Payer: Self-pay | Admitting: Internal Medicine

## 2011-04-23 ENCOUNTER — Ambulatory Visit (INDEPENDENT_AMBULATORY_CARE_PROVIDER_SITE_OTHER): Payer: Self-pay | Admitting: Internal Medicine

## 2011-04-23 ENCOUNTER — Other Ambulatory Visit (INDEPENDENT_AMBULATORY_CARE_PROVIDER_SITE_OTHER): Payer: Self-pay

## 2011-04-23 VITALS — Ht 68.0 in | Wt 157.0 lb

## 2011-04-23 DIAGNOSIS — B9681 Helicobacter pylori [H. pylori] as the cause of diseases classified elsewhere: Secondary | ICD-10-CM

## 2011-04-23 DIAGNOSIS — D62 Acute posthemorrhagic anemia: Secondary | ICD-10-CM

## 2011-04-23 DIAGNOSIS — K269 Duodenal ulcer, unspecified as acute or chronic, without hemorrhage or perforation: Secondary | ICD-10-CM

## 2011-04-23 DIAGNOSIS — A048 Other specified bacterial intestinal infections: Secondary | ICD-10-CM

## 2011-04-23 DIAGNOSIS — K2980 Duodenitis without bleeding: Secondary | ICD-10-CM

## 2011-04-23 LAB — CBC WITH DIFFERENTIAL/PLATELET
Basophils Relative: 1.1 % (ref 0.0–3.0)
Eosinophils Absolute: 0.3 10*3/uL (ref 0.0–0.7)
Eosinophils Relative: 4.8 % (ref 0.0–5.0)
HCT: 35.2 % — ABNORMAL LOW (ref 39.0–52.0)
Lymphs Abs: 1.7 10*3/uL (ref 0.7–4.0)
MCHC: 33.6 g/dL (ref 30.0–36.0)
MCV: 87.8 fl (ref 78.0–100.0)
Monocytes Absolute: 0.6 10*3/uL (ref 0.1–1.0)
Neutro Abs: 3.9 10*3/uL (ref 1.4–7.7)
Neutrophils Relative %: 58.5 % (ref 43.0–77.0)
RBC: 4.02 Mil/uL — ABNORMAL LOW (ref 4.22–5.81)
WBC: 6.6 10*3/uL (ref 4.5–10.5)

## 2011-04-23 MED ORDER — OMEPRAZOLE 20 MG PO CPDR: 20.0000 mg | DELAYED_RELEASE_CAPSULE | Freq: Two times a day (BID) | ORAL | Status: DC

## 2011-04-23 MED ORDER — AMBULATORY NON FORMULARY MEDICATION: Status: DC

## 2011-04-23 NOTE — Patient Instructions (Signed)
We have sent the following medications to your pharmacy for you to pick up at your convenience: Omeclamox-Pak; Please take as directed.  Please take prilosec 1 capsule daily until all are gone.

## 2011-04-23 NOTE — Progress Notes (Signed)
Subjective:    Patient ID: David Galloway, male    DOB: 26-Jul-1975, 36 y.o.   MRN: [id number]  HPI Mr. Santillo is a 36 year old male whom I met during a hospitalization in early February when he presented with fatigue, melena and was found to have duodenal ulcers one with a visible vessel which was treated with injection and clipping. During this hospitalization he did receive 1 unit of packed red cells, and was found to be H. pylori positive. He was discharged with PPI and a prescription for triple therapy and instructions to follow with me. He presents today, reporting that he is much better. His fatigue has much improved, and he denies epigastric abdominal pain. He is eating well and denies nausea or vomiting. No heartburn. No trouble swallowing. Painful swallowing. No further melena or rectal bleeding. Bowel movements for him to the normal. He has returned to work. He was discharged with prescription for triple therapy, but he was unable to fill this due to the expense. His PPI prescription was also too expensive, and therefore he is not on medication now. No fevers or chills.  Review of Systems As per history of present illness, otherwise negative  Patient Active Problem List  Diagnoses  . Upper GI bleeding  . Leukocytosis  . Anemia  . Duodenal ulcer   Past Surgical History  Procedure Date  . No past surgeries   . Esophagogastroduodenoscopy 03/30/2011    Procedure: ESOPHAGOGASTRODUODENOSCOPY (EGD);  Surgeon: Erick Blinks, MD;  Location: [facility];  Service: Gastroenterology;  Laterality: N/A;   Meds currently None  No Known Allergies  Family History  Problem Relation Age of Onset  . Diabetes Father     Social History  . Marital Status: Single   Occupational History  .  [employer]   Social History Main Topics  . Smoking status: Former Smoker    Quit date: 03/28/2011  . Smokeless tobacco: Never Used  . Alcohol Use: No  . Drug Use: No   Social History Narrative   Lives with grandmother. Unloads trucks, Conservation officer, nature, works in [employer] at [employer]. Still quite active. Current smoker, off and on but overall can get up to 2ppd at times. Denies heavy or daily drinking, states only x1 per month. Denies other drugs.        Objective:   Physical Exam Ht 5\' 8"  (1.727 m)  Wt 157 lb (71.215 kg)  BMI 23.87 kg/m2 Constitutional: Well-developed and well-nourished. No distress. HEENT: Normocephalic and atraumatic. Oropharynx is clear and moist. No oropharyngeal exudate. Conjunctivae are normal. Pupils are equal round and reactive to light. No scleral icterus. Neck: Neck supple. Trachea midline. Cardiovascular: Normal rate, regular rhythm and intact distal pulses. No M/R/G Pulmonary/chest: Effort normal and breath sounds normal. No wheezing, rales or rhonchi. Abdominal: Soft, nontender, nondistended. Bowel sounds active throughout. There are no masses palpable. No hepatosplenomegaly. Extremities: no clubbing, cyanosis, or edema Lymphadenopathy: No cervical adenopathy noted. Neurological: Alert and oriented to person place and time. Skin: Skin is warm and dry. No rashes noted. Psychiatric: Normal mood and affect. Behavior is normal.     Assessment & Plan:  Is a 36 year old male seen in hospital followup after being diagnosed with duodenal ulcer secondary to H. pylori infection and acute posthemorrhagic anemia.   1. Duodenal ulcer/H. pylori infection -- the patient has not completed H. pylori therapy as he was unable to afford this. He also is not on PPI at present. Luckily he feels well and is  symptom-free at present. I would like to repeat his CBC to ensure it has improved since discharge. Also, we need to get him back on H. pylori therapy, and I advised and discussed the importance of completing entire course with him.  We will work to obtain samples of antibiotics for him, and he can come back to pick these up. He's also given samples of omeprazole 20 mg to take once  he completes H. pylori therapy.  I would like for him to take the samples and told they are complete, which should give enough time for ulcer healing, assuming he completes antibiotic therapy as directed.  Followup can be as needed  He asks about financial assistance from [facility] regarding his large hospital bill from his recent hospitalization. He is given information to contact [facility] to hopefully obtain such assistance as I am sure he would qualify.

## 2011-04-26 ENCOUNTER — Telehealth: Payer: Self-pay | Admitting: Internal Medicine

## 2011-04-26 NOTE — Telephone Encounter (Signed)
Spoke to pt told him we have the samples for his Omeclamox pak here at the front desk. He said he will be by to pick it up.

## 2011-04-27 ENCOUNTER — Other Ambulatory Visit: Payer: Self-pay | Admitting: Internal Medicine

## 2019-04-29 ENCOUNTER — Emergency Department (HOSPITAL_COMMUNITY)
Admission: EM | Admit: 2019-04-29 | Discharge: 2019-04-30 | Disposition: A | Discharge status: Home / Self Care | Payer: Self-pay | Attending: Emergency Medicine | Admitting: Emergency Medicine

## 2019-04-29 ENCOUNTER — Encounter (HOSPITAL_COMMUNITY): Payer: Self-pay

## 2019-04-29 ENCOUNTER — Other Ambulatory Visit: Payer: Self-pay

## 2019-04-29 ENCOUNTER — Emergency Department (HOSPITAL_COMMUNITY): Payer: Self-pay

## 2019-04-29 DIAGNOSIS — Z23 Encounter for immunization: Secondary | ICD-10-CM | POA: Insufficient documentation

## 2019-04-29 DIAGNOSIS — S61452A Open bite of left hand, initial encounter: Secondary | ICD-10-CM

## 2019-04-29 DIAGNOSIS — S0185XA Open bite of other part of head, initial encounter: Secondary | ICD-10-CM

## 2019-04-29 DIAGNOSIS — Y92830 Public park as the place of occurrence of the external cause: Secondary | ICD-10-CM | POA: Insufficient documentation

## 2019-04-29 DIAGNOSIS — S61216A Laceration without foreign body of right little finger without damage to nail, initial encounter: Secondary | ICD-10-CM | POA: Insufficient documentation

## 2019-04-29 DIAGNOSIS — Z87891 Personal history of nicotine dependence: Secondary | ICD-10-CM | POA: Insufficient documentation

## 2019-04-29 DIAGNOSIS — S01412A Laceration without foreign body of left cheek and temporomandibular area, initial encounter: Secondary | ICD-10-CM | POA: Insufficient documentation

## 2019-04-29 DIAGNOSIS — S61412A Laceration without foreign body of left hand, initial encounter: Secondary | ICD-10-CM | POA: Insufficient documentation

## 2019-04-29 DIAGNOSIS — Z2914 Encounter for prophylactic rabies immune globin: Secondary | ICD-10-CM | POA: Insufficient documentation

## 2019-04-29 DIAGNOSIS — S61451A Open bite of right hand, initial encounter: Secondary | ICD-10-CM

## 2019-04-29 DIAGNOSIS — Y999 Unspecified external cause status: Secondary | ICD-10-CM | POA: Insufficient documentation

## 2019-04-29 DIAGNOSIS — W540XXA Bitten by dog, initial encounter: Secondary | ICD-10-CM | POA: Insufficient documentation

## 2019-04-29 DIAGNOSIS — Y9389 Activity, other specified: Secondary | ICD-10-CM | POA: Insufficient documentation

## 2019-04-29 DIAGNOSIS — Z203 Contact with and (suspected) exposure to rabies: Secondary | ICD-10-CM | POA: Insufficient documentation

## 2019-04-29 DIAGNOSIS — S61512A Laceration without foreign body of left wrist, initial encounter: Secondary | ICD-10-CM | POA: Insufficient documentation

## 2019-04-29 DIAGNOSIS — S61411A Laceration without foreign body of right hand, initial encounter: Secondary | ICD-10-CM | POA: Insufficient documentation

## 2019-04-29 MED ORDER — LIDOCAINE-EPINEPHRINE-TETRACAINE (LET) TOPICAL GEL
3.0000 mL | Freq: Once | TOPICAL | Status: AC
  Administered 2019-04-29: 3 mL via TOPICAL
  Filled 2019-04-29: qty 3

## 2019-04-29 MED ORDER — LIDOCAINE-EPINEPHRINE-TETRACAINE (LET) TOPICAL GEL
3.0000 mL | Freq: Once | TOPICAL | Status: AC
  Administered 2019-04-29: 20:00:00 6 mL via TOPICAL
  Filled 2019-04-29: qty 3

## 2019-04-29 MED ORDER — RABIES VACCINE, PCEC IM SUSR
1.0000 mL | Freq: Once | INTRAMUSCULAR | Status: AC
  Administered 2019-04-29: 1 mL via INTRAMUSCULAR
  Filled 2019-04-29: qty 1

## 2019-04-29 MED ORDER — HYDROCODONE-ACETAMINOPHEN 5-325 MG PO TABS: 1.0000 | ORAL_TABLET | Freq: Four times a day (QID) | ORAL | 0 refills | Status: AC | PRN

## 2019-04-29 MED ORDER — HYDROCODONE-ACETAMINOPHEN 5-325 MG PO TABS
1.0000 | ORAL_TABLET | Freq: Once | ORAL | Status: AC
  Administered 2019-04-29: 1 via ORAL
  Filled 2019-04-29: qty 1

## 2019-04-29 MED ORDER — RABIES IMMUNE GLOBULIN 150 UNIT/ML IM INJ
20.0000 [IU]/kg | INJECTION | Freq: Once | INTRAMUSCULAR | Status: AC
  Administered 2019-04-29: 23:00:00 1350 [IU] via INTRAMUSCULAR
  Filled 2019-04-29: qty 9

## 2019-04-29 MED ORDER — AMOXICILLIN-POT CLAVULANATE 875-125 MG PO TABS: 1.0000 | ORAL_TABLET | Freq: Once | ORAL | Status: DC

## 2019-04-29 MED ORDER — IBUPROFEN 600 MG PO TABS: 600.0000 mg | ORAL_TABLET | Freq: Four times a day (QID) | ORAL | 0 refills | Status: AC | PRN

## 2019-04-29 MED ORDER — LIDOCAINE HCL (PF) 1 % IJ SOLN
30.0000 mL | Freq: Once | INTRAMUSCULAR | Status: AC
  Administered 2019-04-29: 20:00:00 30 mL
  Filled 2019-04-29: qty 30

## 2019-04-29 MED ORDER — TETANUS-DIPHTH-ACELL PERTUSSIS 5-2.5-18.5 LF-MCG/0.5 IM SUSP
0.5000 mL | Freq: Once | INTRAMUSCULAR | Status: AC
  Administered 2019-04-29: 20:00:00 0.5 mL via INTRAMUSCULAR
  Filled 2019-04-29: qty 0.5

## 2019-04-29 MED ORDER — AMOXICILLIN-POT CLAVULANATE 875-125 MG PO TABS: 1.0000 | ORAL_TABLET | Freq: Two times a day (BID) | ORAL | 0 refills | Status: DC

## 2019-04-29 NOTE — ED Notes (Signed)
The pt has numerus dog bites to both hands and the lt side of his face oozing still from the wounds.  Both hands swollen   And painful  The lt side of his face has dep cuts also

## 2019-04-29 NOTE — ED Provider Notes (Signed)
[facility] Provider Note   CSN: [id number] Arrival date & time: 04/29/19  1829     History Chief Complaint  Patient presents with  . Animal Bite    David Galloway is a 44 y.o. male.  David Galloway is a 44 y.o. male with a history of sickle cell trait, and duodenal ulcer, who presents to the emergency department after dog bite.  Patient reports just prior to arrival he was walking home through a park when he was attacked by 2 pit bulls.  He reports that he sustained multiple bites to both hands as well as a bite to his left cheek.  He reports some bleeding from the hands.  Denies numbness tingling or weakness.  Patient is not sure when his last tetanus vaccine was completed.  He states that he does not know who the owner of the dog were, there was not anyone with them, he has not made a report with animal control.  He has never been previously vaccinated for rabies. No other bites wounds reports on trunk or lower extremitties.        Past Medical History:  Diagnosis Date  . Duodenal ulcer due to Helicobacter pylori   . Sickle cell trait [facility])     Patient Active Problem List   Diagnosis Date Noted  . Anemia 03/30/2011  . Duodenal ulcer 03/30/2011    Past Surgical History:  Procedure Laterality Date  . ESOPHAGOGASTRODUODENOSCOPY  03/30/2011   Procedure: ESOPHAGOGASTRODUODENOSCOPY (EGD);  Surgeon: Erick Blinks, MD;  Location: [facility];  Service: Gastroenterology;  Laterality: N/A;  . NO PAST SURGERIES         Family History  Problem Relation Age of Onset  . Diabetes Father     Social History   Tobacco Use  . Smoking status: Former Smoker    Quit date: 03/28/2011    Years since quitting: 8.0  . Smokeless tobacco: Never Used  Substance Use Topics  . Alcohol use: No  . Drug use: No    Home Medications Prior to Admission medications   Medication Sig Start Date End Date Taking? Authorizing Provider  AMBULATORY NON FORMULARY  MEDICATION Medication Name: Omeclamox-Pak  Take as directed 04/23/11   Pyrtle, Carie Caddy, MD  omeprazole (PRILOSEC) 20 MG capsule Take 1 capsule (20 mg total) by mouth 2 (two) times daily. 04/23/11 04/22/12  Pyrtle, Carie Caddy, MD    Allergies    Patient has no known allergies.  Review of Systems   Review of Systems  Constitutional: Negative for chills and fever.  Musculoskeletal: Positive for arthralgias. Negative for joint swelling.  Skin: Positive for wound.  Neurological: Negative for weakness and numbness.    Physical Exam Updated Vital Signs BP (!) 140/103 (BP Location: Right Arm)   Pulse 74   Temp 98.1 F (36.7 C) (Oral)   Resp (!) 24   SpO2 100%   Physical Exam Vitals and nursing note reviewed.  Constitutional:      General: He is not in acute distress.    Appearance: Normal appearance. He is well-developed and normal weight. He is not diaphoretic.  HENT:     Head: Normocephalic and atraumatic.     Comments: Two puncture wounds to the left upper cheek and a 2 cm linear laceration to the lower cheek that appears fairly superficial with no active bleeding.  Normal sensation to the face, and no facial asymmetry. Eyes:     General:  Right eye: No discharge.        Left eye: No discharge.  Pulmonary:     Effort: Pulmonary effort is normal. No respiratory distress.  Musculoskeletal:     Comments: Bilateral hands with multiple puncture wounds.  Right hand with larger wound over the base of the ring finger and over the dorsum of the hand.  Left hand with larger wound over the thenar eminence, 2 smaller linear wounds over the dorsum of the wrist.  Patient able to move all fingers with normal strength with flexion and extension.  Sensation present all fingers and hand.  2+ radial pulse and good capillary refill bilaterally.  No wounds further up the arms  Skin:    General: Skin is warm and dry.     Comments: Wounds as described above.  Neurological:     Mental Status: He is alert and  oriented to person, place, and time.     Coordination: Coordination normal.  Psychiatric:        Mood and Affect: Mood normal.        Behavior: Behavior normal.           ED Results / Procedures / Treatments   Labs (all labs ordered are listed, but only abnormal results are displayed) Labs Reviewed - No data to display  EKG None  Radiology No results found.  Procedures .Marland KitchenLaceration Repair  Date/Time: 04/29/2019 10:26 PM Performed by: Jacqlyn Larsen, PA-C Authorized by: Jacqlyn Larsen, PA-C   Consent:    Consent obtained:  Verbal   Consent given by:  Patient   Risks discussed:  Infection, pain and poor cosmetic result   Alternatives discussed:  No treatment Anesthesia (see MAR for exact dosages):    Anesthesia method:  Topical application   Topical anesthetic:  LET Laceration details:    Location:  Face   Face location:  L cheek   Length (cm):  2   Depth (mm):  3 Repair type:    Repair type:  Simple Exploration:    Hemostasis achieved with:  LET and direct pressure   Wound exploration: entire depth of wound probed and visualized     Wound extent: areolar tissue violated     Contaminated: yes (dog bite)   Treatment:    Area cleansed with:  Betadine and saline   Amount of cleaning:  Extensive   Irrigation solution:  Sterile saline   Irrigation volume:  500 ml   Irrigation method:  Pressure wash Skin repair:    Repair method:  Sutures   Suture size:  6-0   Suture material:  Prolene   Suture technique:  Simple interrupted   Number of sutures:  3 Approximation:    Approximation:  Close Post-procedure details:    Dressing:  Open (no dressing)   Patient tolerance of procedure:  Tolerated well, no immediate complications .Marland KitchenLaceration Repair  Date/Time: 04/29/2019 10:28 PM Performed by: Jacqlyn Larsen, PA-C Authorized by: Jacqlyn Larsen, PA-C   Consent:    Consent obtained:  Verbal   Consent given by:  Patient   Risks discussed:  Infection, pain and  poor cosmetic result   Alternatives discussed:  No treatment Anesthesia (see MAR for exact dosages):    Anesthesia method:  Local infiltration   Local anesthetic:  Lidocaine 1% w/o epi Laceration details:    Location:  Hand   Hand location:  L hand, dorsum   Length (cm):  4 (3 cm lac over thenar immenence, 2 cm lac  over dorsum of wrist)   Depth (mm):  2 Repair type:    Repair type:  Simple Pre-procedure details:    Preparation:  Imaging obtained to evaluate for foreign bodies Exploration:    Hemostasis achieved with:  Direct pressure   Wound exploration: wound explored through full range of motion and entire depth of wound probed and visualized     Wound extent: areolar tissue violated   Treatment:    Area cleansed with:  Betadine and saline   Amount of cleaning:  Extensive   Irrigation solution:  Sterile saline (Sterile saline solution mixed with Betadine and hydrogen peroxide)   Irrigation volume:  1L   Irrigation method:  Pressure wash Skin repair:    Repair method:  Sutures   Suture size:  4-0   Suture material:  Prolene   Suture technique:  Simple interrupted   Number of sutures:  4 Approximation:    Approximation:  Loose Post-procedure details:    Dressing:  Bulky dressing   Patient tolerance of procedure:  Tolerated well, no immediate complications .Marland KitchenLaceration Repair  Date/Time: 04/29/2019 10:47 PM Performed by: Dartha Lodge, PA-C Authorized by: Dartha Lodge, PA-C   Consent:    Consent obtained:  Verbal   Consent given by:  Patient   Risks discussed:  Infection, pain and poor wound healing   Alternatives discussed:  No treatment Anesthesia (see MAR for exact dosages):    Anesthesia method:  Local infiltration   Local anesthetic:  Lidocaine 1% w/o epi Laceration details:    Location:  Hand   Hand location:  R hand, dorsum   Length (cm):  11 (11 cm total, 4 cm laceration to the base of the right ring finger, 2 cm laceration in between the webspace of the  fourth and fifth finger, and 5 cm round laceration over the dorsum of the hand)   Depth (mm):  3 Repair type:    Repair type:  Simple Pre-procedure details:    Preparation:  Patient was prepped and draped in usual sterile fashion and imaging obtained to evaluate for foreign bodies Exploration:    Hemostasis achieved with:  Direct pressure   Wound exploration: wound explored through full range of motion and entire depth of wound probed and visualized     Wound extent: areolar tissue violated     Wound extent: no foreign bodies/material noted, no nerve damage noted, no tendon damage noted, no underlying fracture noted and no vascular damage noted   Treatment:    Area cleansed with:  Betadine and saline   Amount of cleaning:  Extensive   Irrigation solution:  Sterile saline (Sterile saline solution with Betadine and hydrogen peroxide)   Irrigation volume:  1L   Irrigation method:  Pressure wash Skin repair:    Repair method:  Sutures   Suture size:  4-0   Suture material:  Prolene   Suture technique:  Simple interrupted   Number of sutures:  13 Approximation:    Approximation:  Loose Post-procedure details:    Dressing:  Bulky dressing   Patient tolerance of procedure:  Tolerated well, no immediate complications   (including critical care time)  Medications Ordered in ED Medications  amoxicillin-clavulanate (AUGMENTIN) 875-125 MG per tablet 1 tablet (has no administration in time range)  lidocaine-EPINEPHrine-tetracaine (LET) topical gel (3 mLs Topical Given 04/29/19 2016)  lidocaine-EPINEPHrine-tetracaine (LET) topical gel (6 mLs Topical Given 04/29/19 2015)  lidocaine (PF) (XYLOCAINE) 1 % injection 30 mL (30 mLs Infiltration Given 04/29/19 2016)  Tdap (BOOSTRIX) injection 0.5 mL (0.5 mLs Intramuscular Given 04/29/19 2021)  HYDROcodone-acetaminophen (NORCO/VICODIN) 5-325 MG per tablet 1 tablet (1 tablet Oral Given 04/29/19 2013)  rabies immune globulin (HYPERAB/KEDRAB) injection 1,350  Units (1,350 Units Intramuscular Given 04/29/19 2240)  rabies vaccine (RABAVERT) injection 1 mL (1 mL Intramuscular Given 04/29/19 2234)    ED Course  I have reviewed the triage vital signs and the nursing notes.  Pertinent labs & imaging results that were available during my care of the patient were reviewed by me and considered in my medical decision making (see chart for details).    MDM Rules/Calculators/A&P                     44 year old male presents with dog bites to bilateral hands and the left cheek.  He has numerous puncture wounds and some larger wounds to both hands, and no wounds elsewhere on the extremities.  1 small laceration to the left cheek with 2 puncture wounds.  Tetanus updated.  Patient unable to obtain any history of rabies vaccination, no owner present at the time of the attack, so he will also be treated with rabies vaccine and immunoglobulin.  Extremely high risk for infection with these wounds was discussed at length with the patient.  All wounds were copiously irrigated with saline, Betadine and peroxide solution by nursing staff.  Larger wounds were loosely closed with sutures on the hands and facial laceration was closed as well.  Rabies immunoglobulin injected into the larger movements, and the rest was given in the larger muscle groups.  Patient started on Augmentin, first dose given in the emergency department.  Discussed appropriate wound care and strict return precautions regarding infection.  Suture removal in 7 to 10 days.  Patient provided prescription for Augmentin as well as pain medication.  Patient expresses understanding and agreement.  Discharged home in good condition.  Final Clinical Impression(s) / ED Diagnoses Final diagnoses:  Dog bite of face, initial encounter  Dog bite of right hand, initial encounter  Dog bite of left hand, initial encounter    Rx / DC Orders ED Discharge Orders    None       Legrand Rams 04/30/19 1416      Blane Ohara, MD 05/01/19 [phone]

## 2019-04-29 NOTE — Discharge Instructions (Signed)
Take entire course of antibiotics as directed, make sure you complete all antibiotics.  For pain take ibuprofen 600 mg every 6 hours, use prescribed Norco for breakthrough pain, this can cause drowsiness do not take before working or driving.  Dog bites are at high risk for infection, monitor closely for redness, swelling, increasing pain or any puslike drainage, if this occurs please immediately return to the emergency department.  You should change her dressings 1-2 times daily and monitor wounds closely.  Sutures will need to be removed in 7 to 10 days, this can be done at the emergency department, PCPs office or urgent care.  You were vaccinated for rabies today and will need to follow-up over the next several days at urgent care for repeat vaccination, please follow the instructions on your rabies vaccination sheet provided today.

## 2019-04-29 NOTE — ED Triage Notes (Signed)
Pt reports he was walking down the road when he was attacked by two pitt bulls. Pt has multiple bites to bilateral hands and puncture marks to Left cheek

## 2019-04-30 NOTE — ED Notes (Signed)
All wounds cleaned  X  With peroxide betadine and shur clens x 3  telfa dressing with bulky bandages to both hands

## 2019-05-08 ENCOUNTER — Encounter (HOSPITAL_COMMUNITY): Payer: Self-pay | Admitting: Emergency Medicine

## 2019-05-08 ENCOUNTER — Other Ambulatory Visit: Payer: Self-pay

## 2019-05-08 ENCOUNTER — Inpatient Hospital Stay (HOSPITAL_COMMUNITY)
Admission: EM | Admit: 2019-05-08 | Discharge: 2019-05-15 | DRG: 580 | Disposition: A | Discharge status: Home Health | Payer: Self-pay | Attending: Internal Medicine | Admitting: Internal Medicine

## 2019-05-08 DIAGNOSIS — M659 Synovitis and tenosynovitis, unspecified: Secondary | ICD-10-CM | POA: Diagnosis present

## 2019-05-08 DIAGNOSIS — F17210 Nicotine dependence, cigarettes, uncomplicated: Secondary | ICD-10-CM | POA: Diagnosis present

## 2019-05-08 DIAGNOSIS — L02512 Cutaneous abscess of left hand: Secondary | ICD-10-CM | POA: Diagnosis present

## 2019-05-08 DIAGNOSIS — M869 Osteomyelitis, unspecified: Secondary | ICD-10-CM | POA: Diagnosis present

## 2019-05-08 DIAGNOSIS — E876 Hypokalemia: Secondary | ICD-10-CM | POA: Diagnosis present

## 2019-05-08 DIAGNOSIS — L089 Local infection of the skin and subcutaneous tissue, unspecified: Secondary | ICD-10-CM

## 2019-05-08 DIAGNOSIS — L03114 Cellulitis of left upper limb: Principal | ICD-10-CM | POA: Diagnosis present

## 2019-05-08 DIAGNOSIS — B9681 Helicobacter pylori [H. pylori] as the cause of diseases classified elsewhere: Secondary | ICD-10-CM | POA: Diagnosis present

## 2019-05-08 DIAGNOSIS — F1210 Cannabis abuse, uncomplicated: Secondary | ICD-10-CM | POA: Diagnosis present

## 2019-05-08 DIAGNOSIS — S61452A Open bite of left hand, initial encounter: Secondary | ICD-10-CM | POA: Diagnosis present

## 2019-05-08 DIAGNOSIS — Z720 Tobacco use: Secondary | ICD-10-CM | POA: Diagnosis present

## 2019-05-08 DIAGNOSIS — D573 Sickle-cell trait: Secondary | ICD-10-CM | POA: Diagnosis present

## 2019-05-08 DIAGNOSIS — M7989 Other specified soft tissue disorders: Secondary | ICD-10-CM

## 2019-05-08 DIAGNOSIS — Z8711 Personal history of peptic ulcer disease: Secondary | ICD-10-CM

## 2019-05-08 DIAGNOSIS — Z20822 Contact with and (suspected) exposure to covid-19: Secondary | ICD-10-CM | POA: Diagnosis present

## 2019-05-08 DIAGNOSIS — W540XXA Bitten by dog, initial encounter: Secondary | ICD-10-CM

## 2019-05-08 DIAGNOSIS — T148XXA Other injury of unspecified body region, initial encounter: Secondary | ICD-10-CM

## 2019-05-08 LAB — CBC WITH DIFFERENTIAL/PLATELET
Abs Immature Granulocytes: 0.02 10*3/uL (ref 0.00–0.07)
Basophils Absolute: 0.1 10*3/uL (ref 0.0–0.1)
Basophils Relative: 1 %
Eosinophils Absolute: 0.3 10*3/uL (ref 0.0–0.5)
Eosinophils Relative: 4 %
HCT: 37.3 % — ABNORMAL LOW (ref 39.0–52.0)
Hemoglobin: 13.4 g/dL (ref 13.0–17.0)
Immature Granulocytes: 0 %
Lymphocytes Relative: 25 %
Lymphs Abs: 2.3 10*3/uL (ref 0.7–4.0)
MCH: 28.6 pg (ref 26.0–34.0)
MCHC: 35.9 g/dL (ref 30.0–36.0)
MCV: 79.5 fL — ABNORMAL LOW (ref 80.0–100.0)
Monocytes Absolute: 1 10*3/uL (ref 0.1–1.0)
Monocytes Relative: 11 %
Neutro Abs: 5.6 10*3/uL (ref 1.7–7.7)
Neutrophils Relative %: 59 %
Platelets: 393 10*3/uL (ref 150–400)
RBC: 4.69 MIL/uL (ref 4.22–5.81)
RDW: 13.4 % (ref 11.5–15.5)
WBC: 9.3 10*3/uL (ref 4.0–10.5)
nRBC: 0 % (ref 0.0–0.2)

## 2019-05-08 NOTE — ED Triage Notes (Signed)
Pt's wound (dog bite) in his hands has continued to swell.  Swelling goes almost up to the elbow.  Reports he has been compliant w/ medications prescribed.  Denies pain or fever.

## 2019-05-09 ENCOUNTER — Emergency Department (HOSPITAL_COMMUNITY): Payer: Self-pay

## 2019-05-09 ENCOUNTER — Inpatient Hospital Stay (HOSPITAL_COMMUNITY): Payer: Self-pay | Admitting: Certified Registered Nurse Anesthetist

## 2019-05-09 ENCOUNTER — Encounter (HOSPITAL_COMMUNITY): Payer: Self-pay | Admitting: Internal Medicine

## 2019-05-09 ENCOUNTER — Encounter (HOSPITAL_COMMUNITY)
Admission: EM | Disposition: A | Discharge status: Home Health | Payer: Self-pay | Source: Home / Self Care | Attending: Internal Medicine

## 2019-05-09 ENCOUNTER — Other Ambulatory Visit: Payer: Self-pay

## 2019-05-09 DIAGNOSIS — E876 Hypokalemia: Secondary | ICD-10-CM | POA: Diagnosis present

## 2019-05-09 DIAGNOSIS — Z720 Tobacco use: Secondary | ICD-10-CM | POA: Diagnosis present

## 2019-05-09 DIAGNOSIS — F1210 Cannabis abuse, uncomplicated: Secondary | ICD-10-CM | POA: Diagnosis present

## 2019-05-09 DIAGNOSIS — B9681 Helicobacter pylori [H. pylori] as the cause of diseases classified elsewhere: Secondary | ICD-10-CM | POA: Diagnosis present

## 2019-05-09 DIAGNOSIS — K269 Duodenal ulcer, unspecified as acute or chronic, without hemorrhage or perforation: Secondary | ICD-10-CM | POA: Diagnosis present

## 2019-05-09 DIAGNOSIS — D573 Sickle-cell trait: Secondary | ICD-10-CM | POA: Diagnosis present

## 2019-05-09 DIAGNOSIS — L03114 Cellulitis of left upper limb: Secondary | ICD-10-CM | POA: Diagnosis present

## 2019-05-09 HISTORY — PX: I & D EXTREMITY: SHX5045

## 2019-05-09 LAB — RESPIRATORY PANEL BY RT PCR (FLU A&B, COVID)
Influenza A by PCR: NEGATIVE
Influenza B by PCR: NEGATIVE
SARS Coronavirus 2 by RT PCR: NEGATIVE

## 2019-05-09 LAB — COMPREHENSIVE METABOLIC PANEL
ALT: 16 U/L (ref 0–44)
AST: 17 U/L (ref 15–41)
Albumin: 3.4 g/dL — ABNORMAL LOW (ref 3.5–5.0)
Alkaline Phosphatase: 54 U/L (ref 38–126)
Anion gap: 10 (ref 5–15)
BUN: 12 mg/dL (ref 6–20)
CO2: 24 mmol/L (ref 22–32)
Calcium: 8.6 mg/dL — ABNORMAL LOW (ref 8.9–10.3)
Chloride: 108 mmol/L (ref 98–111)
Creatinine, Ser: 1.05 mg/dL (ref 0.61–1.24)
GFR calc Af Amer: >60 mL/min (ref >60)
GFR calc non Af Amer: >60 mL/min (ref >60)
Glucose, Bld: 103 mg/dL — ABNORMAL HIGH (ref 70–99)
Potassium: 3.1 mmol/L — ABNORMAL LOW (ref 3.5–5.1)
Sodium: 142 mmol/L (ref 135–145)
Total Bilirubin: 0.4 mg/dL (ref 0.3–1.2)
Total Protein: 6.7 g/dL (ref 6.5–8.1)

## 2019-05-09 LAB — LACTIC ACID, PLASMA
Lactic Acid, Venous: 0.9 mmol/L (ref 0.5–1.9)
Lactic Acid, Venous: 0.9 mmol/L (ref 0.5–1.9)

## 2019-05-09 LAB — HIV ANTIBODY (ROUTINE TESTING W REFLEX): HIV Screen 4th Generation wRfx: NONREACTIVE

## 2019-05-09 LAB — SURGICAL PCR SCREEN
MRSA, PCR: NEGATIVE
Staphylococcus aureus: NEGATIVE

## 2019-05-09 LAB — MAGNESIUM: Magnesium: 2 mg/dL (ref 1.7–2.4)

## 2019-05-09 SURGERY — IRRIGATION AND DEBRIDEMENT EXTREMITY
Anesthesia: General | Laterality: Left

## 2019-05-09 SURGERY — IRRIGATION AND DEBRIDEMENT EXTREMITY
Anesthesia: Choice | Laterality: Left

## 2019-05-09 MED ORDER — BUPIVACAINE HCL (PF) 0.25 % IJ SOLN
INTRAMUSCULAR | Status: DC | PRN
  Administered 2019-05-09: 10 mL

## 2019-05-09 MED ORDER — FENTANYL CITRATE (PF) 250 MCG/5ML IJ SOLN
INTRAMUSCULAR | Status: DC | PRN
  Administered 2019-05-09 (×5): 25 ug via INTRAVENOUS
  Administered 2019-05-09: 50 ug via INTRAVENOUS

## 2019-05-09 MED ORDER — SODIUM CHLORIDE 0.9 % IV SOLN: INTRAVENOUS | Status: DC | PRN

## 2019-05-09 MED ORDER — ACETAMINOPHEN 500 MG PO TABS: 1000.0000 mg | ORAL_TABLET | Freq: Once | ORAL | Status: DC | PRN

## 2019-05-09 MED ORDER — ACETAMINOPHEN 325 MG PO TABS: 650.0000 mg | ORAL_TABLET | Freq: Four times a day (QID) | ORAL | Status: DC | PRN

## 2019-05-09 MED ORDER — OXYCODONE HCL 5 MG/5ML PO SOLN: 5.0000 mg | Freq: Once | ORAL | Status: DC | PRN

## 2019-05-09 MED ORDER — SODIUM CHLORIDE 0.9 % IV SOLN
2.0000 g | Freq: Three times a day (TID) | INTRAVENOUS | Status: DC
  Administered 2019-05-09 – 2019-05-13 (×12): 2 g via INTRAVENOUS
  Filled 2019-05-09 (×16): qty 2

## 2019-05-09 MED ORDER — FENTANYL CITRATE (PF) 250 MCG/5ML IJ SOLN
INTRAMUSCULAR | Status: AC
  Filled 2019-05-09: qty 5

## 2019-05-09 MED ORDER — ONDANSETRON HCL 4 MG PO TABS: 4.0000 mg | ORAL_TABLET | Freq: Four times a day (QID) | ORAL | Status: DC | PRN

## 2019-05-09 MED ORDER — POVIDONE-IODINE 10 % EX SWAB: 2.0000 "application " | Freq: Once | CUTANEOUS | Status: DC

## 2019-05-09 MED ORDER — MORPHINE SULFATE (PF) 2 MG/ML IV SOLN
2.0000 mg | INTRAVENOUS | Status: DC | PRN
  Administered 2019-05-09: 2 mg via INTRAVENOUS
  Filled 2019-05-09: qty 1

## 2019-05-09 MED ORDER — CHLORHEXIDINE GLUCONATE 4 % EX LIQD
60.0000 mL | Freq: Once | CUTANEOUS | Status: AC
  Administered 2019-05-09: 4 via TOPICAL
  Filled 2019-05-09: qty 60

## 2019-05-09 MED ORDER — MIDAZOLAM HCL 2 MG/2ML IJ SOLN
INTRAMUSCULAR | Status: AC
  Filled 2019-05-09: qty 2

## 2019-05-09 MED ORDER — ACETAMINOPHEN 650 MG RE SUPP: 650.0000 mg | Freq: Four times a day (QID) | RECTAL | Status: DC | PRN

## 2019-05-09 MED ORDER — VANCOMYCIN HCL 750 MG/150ML IV SOLN
750.0000 mg | Freq: Two times a day (BID) | INTRAVENOUS | Status: DC
  Administered 2019-05-10 – 2019-05-13 (×7): 750 mg via INTRAVENOUS
  Filled 2019-05-09 (×8): qty 150

## 2019-05-09 MED ORDER — MIDAZOLAM HCL 2 MG/2ML IJ SOLN
INTRAMUSCULAR | Status: DC | PRN
  Administered 2019-05-09: 2 mg via INTRAVENOUS

## 2019-05-09 MED ORDER — FENTANYL CITRATE (PF) 100 MCG/2ML IJ SOLN: 25.0000 ug | INTRAMUSCULAR | Status: DC | PRN

## 2019-05-09 MED ORDER — ONDANSETRON HCL 4 MG/2ML IJ SOLN
INTRAMUSCULAR | Status: DC | PRN
  Administered 2019-05-09: 4 mg via INTRAVENOUS

## 2019-05-09 MED ORDER — ACETAMINOPHEN 10 MG/ML IV SOLN: 1000.0000 mg | Freq: Once | INTRAVENOUS | Status: DC | PRN

## 2019-05-09 MED ORDER — GADOBUTROL 1 MMOL/ML IV SOLN
7.0000 mL | Freq: Once | INTRAVENOUS | Status: AC | PRN
  Administered 2019-05-09: 7 mL via INTRAVENOUS

## 2019-05-09 MED ORDER — ACETAMINOPHEN 160 MG/5ML PO SOLN: 1000.0000 mg | Freq: Once | ORAL | Status: DC | PRN

## 2019-05-09 MED ORDER — 0.9 % SODIUM CHLORIDE (POUR BTL) OPTIME
TOPICAL | Status: DC | PRN
  Administered 2019-05-09: 1000 mL

## 2019-05-09 MED ORDER — LIDOCAINE 2% (20 MG/ML) 5 ML SYRINGE
INTRAMUSCULAR | Status: DC | PRN
  Administered 2019-05-09: 60 mg via INTRAVENOUS

## 2019-05-09 MED ORDER — SODIUM CHLORIDE 0.9 % IV SOLN
3.0000 g | Freq: Once | INTRAVENOUS | Status: AC
  Administered 2019-05-09: 3 g via INTRAVENOUS
  Filled 2019-05-09: qty 8

## 2019-05-09 MED ORDER — POTASSIUM CHLORIDE IN NACL 40-0.9 MEQ/L-% IV SOLN
INTRAVENOUS | Status: DC
  Administered 2019-05-09 – 2019-05-10 (×3): 100 mL/h via INTRAVENOUS
  Filled 2019-05-09 (×3): qty 1000

## 2019-05-09 MED ORDER — DEXAMETHASONE SODIUM PHOSPHATE 10 MG/ML IJ SOLN
INTRAMUSCULAR | Status: DC | PRN
  Administered 2019-05-09: 10 mg via INTRAVENOUS

## 2019-05-09 MED ORDER — ONDANSETRON HCL 4 MG/2ML IJ SOLN
4.0000 mg | Freq: Four times a day (QID) | INTRAMUSCULAR | Status: DC | PRN
  Administered 2019-05-09: 4 mg via INTRAVENOUS
  Filled 2019-05-09: qty 2

## 2019-05-09 MED ORDER — MUPIROCIN 2 % EX OINT
1.0000 "application " | TOPICAL_OINTMENT | Freq: Two times a day (BID) | CUTANEOUS | Status: DC
  Filled 2019-05-09: qty 22

## 2019-05-09 MED ORDER — SODIUM CHLORIDE 0.9 % IV SOLN: 1.0000 g | INTRAVENOUS | Status: DC

## 2019-05-09 MED ORDER — BUPIVACAINE HCL (PF) 0.25 % IJ SOLN
INTRAMUSCULAR | Status: AC
  Filled 2019-05-09: qty 30

## 2019-05-09 MED ORDER — PROPOFOL 10 MG/ML IV BOLUS
INTRAVENOUS | Status: DC | PRN
  Administered 2019-05-09: 200 mg via INTRAVENOUS

## 2019-05-09 MED ORDER — VANCOMYCIN HCL 1500 MG/300ML IV SOLN
1500.0000 mg | Freq: Once | INTRAVENOUS | Status: AC
  Administered 2019-05-09: 1500 mg via INTRAVENOUS
  Filled 2019-05-09: qty 300

## 2019-05-09 MED ORDER — OXYCODONE HCL 5 MG PO TABS: 5.0000 mg | ORAL_TABLET | Freq: Once | ORAL | Status: DC | PRN

## 2019-05-09 MED ORDER — SODIUM CHLORIDE 0.9 % IR SOLN
Status: DC | PRN
  Administered 2019-05-09: 3000 mL

## 2019-05-09 SURGICAL SUPPLY — 44 items
BNDG ELASTIC 3X5.8 VLCR STR LF (GAUZE/BANDAGES/DRESSINGS) ×2 IMPLANT
BNDG ELASTIC 4X5.8 VLCR STR LF (GAUZE/BANDAGES/DRESSINGS) ×3 IMPLANT
BNDG ESMARK 4X9 LF (GAUZE/BANDAGES/DRESSINGS) IMPLANT
BNDG GAUZE ELAST 4 BULKY (GAUZE/BANDAGES/DRESSINGS) ×5 IMPLANT
CHLORAPREP W/TINT 26 (MISCELLANEOUS) ×3 IMPLANT
CORD BIPOLAR FORCEPS 12FT (ELECTRODE) ×3 IMPLANT
COVER SURGICAL LIGHT HANDLE (MISCELLANEOUS) ×3 IMPLANT
COVER WAND RF STERILE (DRAPES) ×3 IMPLANT
CUFF TOURN SGL QUICK 18X4 (TOURNIQUET CUFF) ×3 IMPLANT
DRAIN PENROSE 1/4X12 LTX STRL (WOUND CARE) ×2 IMPLANT
DRAPE U-SHAPE 47X51 STRL (DRAPES) ×3 IMPLANT
GAUZE SPONGE 4X4 12PLY STRL (GAUZE/BANDAGES/DRESSINGS) ×3 IMPLANT
GAUZE XEROFORM 5X9 LF (GAUZE/BANDAGES/DRESSINGS) ×3 IMPLANT
GLOVE BIOGEL PI IND STRL 8 (GLOVE) ×1 IMPLANT
GLOVE BIOGEL PI INDICATOR 8 (GLOVE) ×2
GLOVE SURG SYN 7.5  E (GLOVE) ×3
GLOVE SURG SYN 7.5 E (GLOVE) ×1 IMPLANT
GLOVE SURG SYN 7.5 PF PI (GLOVE) ×1 IMPLANT
GOWN STRL REUS W/ TWL LRG LVL3 (GOWN DISPOSABLE) ×1 IMPLANT
GOWN STRL REUS W/TWL LRG LVL3 (GOWN DISPOSABLE) ×3
KIT BASIN OR (CUSTOM PROCEDURE TRAY) ×3 IMPLANT
KIT TURNOVER KIT B (KITS) ×3 IMPLANT
MANIFOLD NEPTUNE II (INSTRUMENTS) ×3 IMPLANT
NDL HYPO 25GX1X1/2 BEV (NEEDLE) IMPLANT
NEEDLE HYPO 25GX1X1/2 BEV (NEEDLE) IMPLANT
NS IRRIG 1000ML POUR BTL (IV SOLUTION) ×3 IMPLANT
PACK ORTHO EXTREMITY (CUSTOM PROCEDURE TRAY) ×3 IMPLANT
PAD ARMBOARD 7.5X6 YLW CONV (MISCELLANEOUS) ×3 IMPLANT
PAD CAST 4YDX4 CTTN HI CHSV (CAST SUPPLIES) ×1 IMPLANT
PADDING CAST COTTON 4X4 STRL (CAST SUPPLIES) ×3
SET CYSTO W/LG BORE CLAMP LF (SET/KITS/TRAYS/PACK) ×3 IMPLANT
SPONGE LAP 4X18 RFD (DISPOSABLE) ×3 IMPLANT
SUT PROLENE 4 0 P 3 18 (SUTURE) ×2 IMPLANT
SWAB CULTURE ESWAB REG 1ML (MISCELLANEOUS) ×2 IMPLANT
SYR CONTROL 10ML LL (SYRINGE) ×2 IMPLANT
TOWEL GREEN STERILE (TOWEL DISPOSABLE) ×3 IMPLANT
TOWEL GREEN STERILE FF (TOWEL DISPOSABLE) ×3 IMPLANT
TUBE CONNECTING 12'X1/4 (SUCTIONS) ×1
TUBE CONNECTING 12X1/4 (SUCTIONS) ×2 IMPLANT
TUBING TUR DISP (UROLOGICAL SUPPLIES) IMPLANT
UNDERPAD 30X30 (UNDERPADS AND DIAPERS) ×3 IMPLANT
UNDERPAD 30X36 HEAVY ABSORB (UNDERPADS AND DIAPERS) ×3 IMPLANT
WATER STERILE IRR 1000ML POUR (IV SOLUTION) ×3 IMPLANT
YANKAUER SUCT BULB TIP NO VENT (SUCTIONS) ×3 IMPLANT

## 2019-05-09 NOTE — Anesthesia Preprocedure Evaluation (Signed)
Anesthesia Evaluation  Patient identified by MRN, date of birth, ID band Patient awake    Reviewed: Allergy & Precautions, NPO status , Patient's Chart, lab work & pertinent test results  History of Anesthesia Complications Negative for: history of anesthetic complications  Airway Mallampati: I  TM Distance: >3 FB Neck ROM: Full    Dental  (+) Teeth Intact, Dental Advisory Given,    Pulmonary neg recent URI, former smoker,    breath sounds clear to auscultation       Cardiovascular negative cardio ROS   Rhythm:Regular     Neuro/Psych negative neurological ROS  negative psych ROS   GI/Hepatic Neg liver ROS, PUD,   Endo/Other  negative endocrine ROS  Renal/GU negative Renal ROS     Musculoskeletal  left hand infection   Abdominal   Peds negative pediatric ROS (+)  Hematology negative hematology ROS (+)   Anesthesia Other Findings   Reproductive/Obstetrics                             Anesthesia Physical Anesthesia Plan  ASA: I  Anesthesia Plan: General   Post-op Pain Management:    Induction: Intravenous  PONV Risk Score and Plan: 2 and Ondansetron and Dexamethasone  Airway Management Planned: LMA  Additional Equipment: None  Intra-op Plan:   Post-operative Plan: Extubation in OR  Informed Consent: I have reviewed the patients History and Physical, chart, labs and discussed the procedure including the risks, benefits and alternatives for the proposed anesthesia with the patient or authorized representative who has indicated his/her understanding and acceptance.     Dental advisory given  Plan Discussed with: CRNA and Surgeon  Anesthesia Plan Comments:         Anesthesia Quick Evaluation

## 2019-05-09 NOTE — Anesthesia Procedure Notes (Signed)
Procedure Name: LMA Insertion Date/Time: 05/09/2019 5:05 PM Performed by: Dairl Ponder, CRNA Pre-anesthesia Checklist: Patient identified, Emergency Drugs available, Suction available, Patient being monitored and Timeout performed Patient Re-evaluated:Patient Re-evaluated prior to induction Oxygen Delivery Method: Circle system utilized Preoxygenation: Pre-oxygenation with 100% oxygen Induction Type: IV induction Ventilation: Mask ventilation without difficulty LMA: LMA inserted LMA Size: 4.0 Number of attempts: 1 Placement Confirmation: positive ETCO2 and breath sounds checked- equal and bilateral Tube secured with: Tape Dental Injury: Teeth and Oropharynx as per pre-operative assessment

## 2019-05-09 NOTE — Consult Note (Signed)
Reason for Consult:Left hand swelling Referring Physician: C Horton  David Galloway is an 44 y.o. male.  HPI: David Galloway was attacked by 2 dogs 10d ago. He came to the ED at that time, had his hand lacs repaired, and was sent home on Augmentin which he finished. He returns today with c/o continued hand swelling. He denies pain, fevers, chills, sweats, N/V. He is RHD.  Past Medical History:  Diagnosis Date  . Duodenal ulcer due to Helicobacter pylori   . Sickle cell trait [facility])     Past Surgical History:  Procedure Laterality Date  . ESOPHAGOGASTRODUODENOSCOPY  03/30/2011   Procedure: ESOPHAGOGASTRODUODENOSCOPY (EGD);  Surgeon: Zenovia Jarred, MD;  Location: [facility];  Service: Gastroenterology;  Laterality: N/A;  . NO PAST SURGERIES      Family History  Problem Relation Age of Onset  . Diabetes Father     Social History:  reports that he quit smoking about 8 years ago. He has never used smokeless tobacco. He reports that he does not drink alcohol or use drugs.  Allergies: No Known Allergies  Medications: I have reviewed the patient's current medications.  Results for orders placed or performed during the hospital encounter of 05/08/19 (from the past 48 hour(s))  Comprehensive metabolic panel     Status: Abnormal   Collection Time: 05/08/19 11:37 PM  Result Value Ref Range   Sodium 142 135 - 145 mmol/L   Potassium 3.1 (L) 3.5 - 5.1 mmol/L   Chloride 108 98 - 111 mmol/L   CO2 24 22 - 32 mmol/L   Glucose, Bld 103 (H) 70 - 99 mg/dL    Comment: Glucose reference range applies only to samples taken after fasting for at least 8 hours.   BUN 12 6 - 20 mg/dL   Creatinine, Ser 1.05 0.61 - 1.24 mg/dL   Calcium 8.6 (L) 8.9 - 10.3 mg/dL   Total Protein 6.7 6.5 - 8.1 g/dL   Albumin 3.4 (L) 3.5 - 5.0 g/dL   AST 17 15 - 41 U/L   ALT 16 0 - 44 U/L   Alkaline Phosphatase 54 38 - 126 U/L   Total Bilirubin 0.4 0.3 - 1.2 mg/dL   GFR calc non Af Amer >60 >60 mL/min   GFR calc Af Amer >60 >60 mL/min    Anion gap 10 5 - 15    Comment: Performed at [facility], [place] [address], [place], [place] [address]  CBC with Differential     Status: Abnormal   Collection Time: 05/08/19 11:37 PM  Result Value Ref Range   WBC 9.3 4.0 - 10.5 K/uL   RBC 4.69 4.22 - 5.81 MIL/uL   Hemoglobin 13.4 13.0 - 17.0 g/dL   HCT 37.3 (L) 39.0 - 52.0 %   MCV 79.5 (L) 80.0 - 100.0 fL   MCH 28.6 26.0 - 34.0 pg   MCHC 35.9 30.0 - 36.0 g/dL   RDW 13.4 11.5 - 15.5 %   Platelets 393 150 - 400 K/uL   nRBC 0.0 0.0 - 0.2 %   Neutrophils Relative % 59 %   Neutro Abs 5.6 1.7 - 7.7 K/uL   Lymphocytes Relative 25 %   Lymphs Abs 2.3 0.7 - 4.0 K/uL   Monocytes Relative 11 %   Monocytes Absolute 1.0 0.1 - 1.0 K/uL   Eosinophils Relative 4 %   Eosinophils Absolute 0.3 0.0 - 0.5 K/uL   Basophils Relative 1 %   Basophils Absolute 0.1 0.0 - 0.1 K/uL  Immature Granulocytes 0 %   Abs Immature Granulocytes 0.02 0.00 - 0.07 K/uL    Comment: Performed at [facility], [address] [place], [place] [address]  Lactic acid, plasma     Status: None   Collection Time: 05/08/19 11:59 PM  Result Value Ref Range   Lactic Acid, Venous 0.9 0.5 - 1.9 mmol/L    Comment: Performed at [facility], [address], [place], [place] [address]  Lactic acid, plasma     Status: None   Collection Time: 05/09/19  6:40 AM  Result Value Ref Range   Lactic Acid, Venous 0.9 0.5 - 1.9 mmol/L    Comment: Performed at [facility], [address] [place], [place] [address]    No results found.  Review of Systems  Constitutional: Negative for chills, diaphoresis and fever.  HENT: Negative for ear discharge, ear pain, hearing loss and tinnitus.   Eyes: Negative for photophobia and pain.  Respiratory: Negative for cough and shortness of breath.   Cardiovascular: Negative for chest pain.  Gastrointestinal: Negative for abdominal pain, nausea and vomiting.  Genitourinary: Negative for dysuria, flank pain, frequency and  urgency.  Musculoskeletal: Positive for joint swelling (Left hand). Negative for arthralgias, back pain, myalgias and neck pain.  Neurological: Negative for dizziness and headaches.  Hematological: Does not bruise/bleed easily.  Psychiatric/Behavioral: The patient is not nervous/anxious.    Blood pressure 130/90, pulse 73, temperature 98.6 F (37 C), temperature source Oral, resp. rate 14, height 6' (1.829 m), weight 65.8 kg, SpO2 100 %. Physical Exam  Constitutional: He appears well-developed and well-nourished. No distress.  HENT:  Head: Normocephalic and atraumatic.  Eyes: Conjunctivae are normal. Right eye exhibits no discharge. Left eye exhibits no discharge. No scleral icterus.  Cardiovascular: Normal rate and regular rhythm.  Respiratory: Effort normal. No respiratory distress.  Musculoskeletal:     Cervical back: Normal range of motion.     Comments: Left shoulder, elbow, wrist, digits- Healed lacs to hand, nontender, mod diffuse hand edema, no fluctuance, no instability, no blocks to motion  Sens  Ax/R/M/U intact  Mot   Ax/ M/ AIN/ U intact, R/PIN weak  Rad 2+  Neurological: He is alert.  Skin: Skin is warm and dry. He is not diaphoretic.  Psychiatric: He has a normal mood and affect. His behavior is normal.    Assessment/Plan: Left hand swelling -- Will obtain MRI to make sure there are no fluid collections and look at nerves. If no acute treatment needed he may f/u with Dr. Roney Mans as OP. Multiple medical problems including sickle cell trait and PUD    [name], PA-C Orthopedic Surgery [phone] 05/09/2019, 9:12 AM

## 2019-05-09 NOTE — ED Provider Notes (Signed)
[facility] EMERGENCY DEPARTMENT Provider Note   CSN: [id number] Arrival date & time: 05/08/19  2210     History Chief Complaint  Patient presents with  . Wound Infection    David Galloway is a 44 y.o. male.  The history is provided by the patient. No language interpreter was used.   Pt reports he had a dog bite to both hands.  Pt reports left hand is swollen and tender.  Pt reports right hand has decreased swelling and feels better.     Past Medical History:  Diagnosis Date  . Duodenal ulcer due to Helicobacter pylori   . Sickle cell trait [facility])     Patient Active Problem List   Diagnosis Date Noted  . Anemia 03/30/2011  . Duodenal ulcer 03/30/2011    Past Surgical History:  Procedure Laterality Date  . ESOPHAGOGASTRODUODENOSCOPY  03/30/2011   Procedure: ESOPHAGOGASTRODUODENOSCOPY (EGD);  Surgeon: Erick Blinks, MD;  Location: [facility];  Service: Gastroenterology;  Laterality: N/A;  . NO PAST SURGERIES         Family History  Problem Relation Age of Onset  . Diabetes Father     Social History   Tobacco Use  . Smoking status: Former Smoker    Quit date: 03/28/2011    Years since quitting: 8.1  . Smokeless tobacco: Never Used  Substance Use Topics  . Alcohol use: No  . Drug use: No    Home Medications Prior to Admission medications   Medication Sig Start Date End Date Taking? Authorizing Provider  AMBULATORY NON FORMULARY MEDICATION Medication Name: Omeclamox-Pak  Take as directed Patient not taking: Reported on 05/09/2019 04/23/11   Beverley Fiedler, MD  amoxicillin-clavulanate (AUGMENTIN) 875-125 MG tablet Take 1 tablet by mouth 2 (two) times daily. One po bid x 7 days Patient not taking: Reported on 05/09/2019 04/29/19   Dartha Lodge, PA-C  HYDROcodone-acetaminophen [facility]) 5-325 MG tablet Take 1 tablet by mouth every 6 (six) hours as needed. Patient not taking: Reported on 05/09/2019 04/29/19   Dartha Lodge, PA-C  ibuprofen (ADVIL) 600 MG  tablet Take 1 tablet (600 mg total) by mouth every 6 (six) hours as needed. Patient not taking: Reported on 05/09/2019 04/29/19   Dartha Lodge, PA-C    Allergies    Patient has no known allergies.  Review of Systems   Review of Systems  All other systems reviewed and are negative.   Physical Exam Updated Vital Signs BP 130/90 (BP Location: Right Arm)   Pulse 73   Temp 98.6 F (37 C) (Oral)   Resp 14   Ht 6' (1.829 m)   Wt 65.8 kg   SpO2 100%   BMI 19.67 kg/m   Physical Exam Vitals and nursing note reviewed.  Constitutional:      Appearance: He is well-developed.  HENT:     Head: Normocephalic and atraumatic.  Eyes:     Conjunctiva/sclera: Conjunctivae normal.  Cardiovascular:     Rate and Rhythm: Normal rate and regular rhythm.     Heart sounds: No murmur.  Pulmonary:     Effort: Pulmonary effort is normal. No respiratory distress.     Breath sounds: Normal breath sounds.  Abdominal:     Tenderness: There is no abdominal tenderness.  Musculoskeletal:        General: Swelling and tenderness present.     Cervical back: Neck supple.     Comments: Swollen left hand, sutures in place, nv and ns intact  Skin:    General: Skin is warm and dry.  Neurological:     Mental Status: He is alert.  Psychiatric:        Mood and Affect: Mood normal.     ED Results / Procedures / Treatments   Labs (all labs ordered are listed, but only abnormal results are displayed) Labs Reviewed  COMPREHENSIVE METABOLIC PANEL - Abnormal; Notable for the following components:      Result Value   Potassium 3.1 (*)    Glucose, Bld 103 (*)    Calcium 8.6 (*)    Albumin 3.4 (*)    All other components within normal limits  CBC WITH DIFFERENTIAL/PLATELET - Abnormal; Notable for the following components:   HCT 37.3 (*)    MCV 79.5 (*)    All other components within normal limits  LACTIC ACID, PLASMA  LACTIC ACID, PLASMA    EKG None  Radiology No results  found.  Procedures Procedures (including critical care time)  Medications Ordered in ED Medications  Ampicillin-Sulbactam (UNASYN) 3 g in sodium chloride 0.9 % 100 mL IVPB (3 g Intravenous New Bag/Given 05/09/19 0746)    ED Course  I have reviewed the triage vital signs and the nursing notes.  Pertinent labs & imaging results that were available during my care of the patient were reviewed by me and considered in my medical decision making (see chart for details).    MDM Rules/Calculators/A&P                      Pt given unasyn IV.   Jaci Standard will see here. MR ordered.  Mri shows sptic tenosynovitis 2nd, 3rd and 4th metacarpals.  Dr. Jeannie Fend will take to [place] to clean out.  Request hospitalist admission  Final Clinical Impression(s) / ED Diagnoses Final diagnoses:  Wound infection  Dog bite of left hand, initial encounter    Rx / DC Orders ED Discharge Orders    None       Sidney Ace 05/09/19 1232    Merryl Hacker, MD 05/12/19 2302

## 2019-05-09 NOTE — Transfer of Care (Signed)
Immediate Anesthesia Transfer of Care Note  Patient: David Galloway  Procedure(s) Performed: IRRIGATION AND DEBRIDEMENT EXTREMITY (Left )  Patient Location: PACU  Anesthesia Type:General  Level of Consciousness: sedated  Airway & Oxygen Therapy: Patient Spontanous Breathing and Patient connected to nasal cannula oxygen  Post-op Assessment: Report given to RN and Post -op Vital signs reviewed and stable  Post vital signs: Reviewed and stable  Last Vitals:  Vitals Value Taken Time  BP 128/86 05/09/19 1815  Temp    Pulse 111 05/09/19 1816  Resp 17 05/09/19 1816  SpO2 100 % 05/09/19 1816  Vitals shown include unvalidated device data.  Last Pain:  Vitals:   05/09/19 1538  TempSrc: Oral  PainSc: 0-No pain         Complications: No apparent anesthesia complications

## 2019-05-09 NOTE — Progress Notes (Signed)
Pharmacy Antibiotic Note  David Galloway is a 44 y.o. male admitted on 05/08/2019 with osteomyelitis and cellulitis.  Pharmacy has been consulted for vancomycin and cefepime dosing. Pt is afebrile and WBC is WNL. Scr and lactic acid are WNL. Pt was attacked by dogs 10 days PTA and send home on augmentin.   Plan: Cefepime 2gm IV Q8H Vancomycin 1500mg  IV x 1 then 750mg  IV Q12H  F/u renal fxn, C&S, clinical status and peak/trough at SS  Height: 6' (182.9 cm) Weight: 145 lb (65.8 kg) IBW/kg (Calculated) : 77.6  Temp (24hrs), Avg:98.6 F (37 C), Min:98.6 F (37 C), Max:98.6 F (37 C)  Recent Labs  Lab 05/08/19 2337 05/08/19 2359 05/09/19 0640  WBC 9.3  --   --   CREATININE 1.05  --   --   LATICACIDVEN  --  0.9 0.9    Estimated Creatinine Clearance: 82.7 mL/min (by C-G formula based on SCr of 1.05 mg/dL).    No Known Allergies  Antimicrobials this admission: Vanc 3/17>> Cefepime 3/17>> Unasyn x 1 3/17  Dose adjustments this admission: N/A  Microbiology results: Pending  Thank you for allowing pharmacy to be a part of this patient's care.  Loveah Like, 05/11/19 05/09/2019 1:19 PM

## 2019-05-09 NOTE — H&P (Signed)
History and Physical    David Galloway:[id number] DOB: [birth year] DOA: 05/08/2019  PCP: Patient, No Pcp Per  Patient coming from: Home  I have personally briefly reviewed patient's old medical records in [facility]  Chief Complaint: Left hand swelling HPI: David Galloway is a 44 y.o. male with medical history significant of tobacco abuse, marijuana abuse, sickle cell trait, duodenal ulcer due to Helicobacter pylori presents to emergency department due to left hand swelling since 10 days.  Patient tells me that he was attacked by 2 dogs 10 days ago on his hands and face.  He came to emergency department at that time and his laceration was repaired and was sent home on p.o. Augmentin which he finished as prescribed.  Today he reports that his left hand swelling is getting worse since 2 days.  His right hand and face wounds has resolved.  No association with fever, chills, weakness, lethargic, nausea, vomiting, redness, pain, decreased appetite, urinary or bowel changes.  He smokes half to 1 pack of cigarettes per day, uses marijuana occasionally.  No alcohol abuse history.  Has history of duodenal ulcer secondary to H. pylori infection which was treated appropriately in 2013.  No history of melena.  ED Course: Upon arrival to ED: Patient's vital signs stable, potassium 3.1, lactic acid: WNL, blood culture and COVID-19 pending.  MRI left hand showed cellulitis of the dorsum of the hand with subcutaneous abscess over the metacarpal.  Findings consistent with septic tenosynovitis of the compartment 2, 3 and 4 extensor tendons, worse in compartment 4.  Marrow edema and enhancement in the dorsal aspect of the capitate and trapezoid bones most consistent with osteomyelitis.  Patient received IV Unasyn in ED.  EDP consulted orthopedic surgery for further evaluation and management.   review of Systems: As per HPI otherwise negative.    Past Medical History:  Diagnosis Date  . Duodenal ulcer due  to Helicobacter pylori   . Sickle cell trait [facility])     Past Surgical History:  Procedure Laterality Date  . ESOPHAGOGASTRODUODENOSCOPY  03/30/2011   Procedure: ESOPHAGOGASTRODUODENOSCOPY (EGD);  Surgeon: Erick Blinks, MD;  Location: [facility];  Service: Gastroenterology;  Laterality: N/A;  . NO PAST SURGERIES       reports that he quit smoking about 8 years ago. He has never used smokeless tobacco. He reports that he does not drink alcohol or use drugs.  No Known Allergies  Family History  Problem Relation Age of Onset  . Diabetes Father     Prior to Admission medications   Medication Sig Start Date End Date Taking? Authorizing Provider  AMBULATORY NON FORMULARY MEDICATION Medication Name: Omeclamox-Pak  Take as directed Patient not taking: Reported on 05/09/2019 04/23/11   Beverley Fiedler, MD  amoxicillin-clavulanate (AUGMENTIN) 875-125 MG tablet Take 1 tablet by mouth 2 (two) times daily. One po bid x 7 days Patient not taking: Reported on 05/09/2019 04/29/19   Dartha Lodge, PA-C  HYDROcodone-acetaminophen [facility]) 5-325 MG tablet Take 1 tablet by mouth every 6 (six) hours as needed. Patient not taking: Reported on 05/09/2019 04/29/19   Dartha Lodge, PA-C  ibuprofen (ADVIL) 600 MG tablet Take 1 tablet (600 mg total) by mouth every 6 (six) hours as needed. Patient not taking: Reported on 05/09/2019 04/29/19   Dartha Lodge, PA-C    Physical Exam: Vitals:   05/08/19 2223 05/09/19 0532 05/09/19 0637  BP: 135/85 130/90   Pulse: 81 73   Resp: 16 14  Temp: 98.6 F (37 C)    TempSrc: Oral    SpO2: 99% 100%   Weight:   65.8 kg  Height:   6' (1.829 m)    Constitutional: NAD, calm, comfortable, communicating well Eyes: PERRL, lids and conjunctivae normal ENMT: Mucous membranes are moist. Posterior pharynx clear of any exudate or lesions.Normal dentition.  Neck: normal, supple, no masses, no thyromegaly Respiratory: clear to auscultation bilaterally, no wheezing, no crackles. Normal  respiratory effort. No accessory muscle use.  Cardiovascular: Regular rate and rhythm, no murmurs / rubs / gallops. No extremity edema. 2+ pedal pulses. No carotid bruits.  Abdomen: no tenderness, no masses palpated. No hepatosplenomegaly. Bowel sounds positive.  Musculoskeletal:      Neurologic: CN 2-12 grossly intact. Sensation intact, DTR normal. Strength 5/5 in all 4.  Psychiatric: Normal judgment and insight. Alert and oriented x 3. Normal mood.    Labs on Admission: I have personally reviewed following labs and imaging studies  CBC: Recent Labs  Lab 05/08/19 2337  WBC 9.3  NEUTROABS 5.6  HGB 13.4  HCT 37.3*  MCV 79.5*  PLT 829   Basic Metabolic Panel: Recent Labs  Lab 05/08/19 2337  NA 142  K 3.1*  CL 108  CO2 24  GLUCOSE 103*  BUN 12  CREATININE 1.05  CALCIUM 8.6*   GFR: Estimated Creatinine Clearance: 82.7 mL/min (by C-G formula based on SCr of 1.05 mg/dL). Liver Function Tests: Recent Labs  Lab 05/08/19 2337  AST 17  ALT 16  ALKPHOS 54  BILITOT 0.4  PROT 6.7  ALBUMIN 3.4*   No results for input(s): LIPASE, AMYLASE in the last 168 hours. No results for input(s): AMMONIA in the last 168 hours. Coagulation Profile: No results for input(s): INR, PROTIME in the last 168 hours. Cardiac Enzymes: No results for input(s): CKTOTAL, CKMB, CKMBINDEX, TROPONINI in the last 168 hours. BNP (last 3 results) No results for input(s): PROBNP in the last 8760 hours. HbA1C: No results for input(s): HGBA1C in the last 72 hours. CBG: No results for input(s): GLUCAP in the last 168 hours. Lipid Profile: No results for input(s): CHOL, HDL, LDLCALC, TRIG, CHOLHDL, LDLDIRECT in the last 72 hours. Thyroid Function Tests: No results for input(s): TSH, T4TOTAL, FREET4, T3FREE, THYROIDAB in the last 72 hours. Anemia Panel: No results for input(s): VITAMINB12, FOLATE, FERRITIN, TIBC, IRON, RETICCTPCT in the last 72 hours. Urine analysis:    Component Value Date/Time    COLORURINE YELLOW 03/29/2011 2047   APPEARANCEUR CLEAR 03/29/2011 2047   LABSPEC 1.025 03/29/2011 2047   PHURINE 5.5 03/29/2011 2047   GLUCOSEU NEGATIVE 03/29/2011 2047   HGBUR NEGATIVE 03/29/2011 2047   BILIRUBINUR NEGATIVE 03/29/2011 2047   KETONESUR TRACE (A) 03/29/2011 2047   PROTEINUR NEGATIVE 03/29/2011 2047   UROBILINOGEN 0.2 03/29/2011 2047   NITRITE NEGATIVE 03/29/2011 2047   LEUKOCYTESUR NEGATIVE 03/29/2011 2047    Radiological Exams on Admission: MR HAND LEFT W WO CONTRAST  Result Date: 05/09/2019 CLINICAL DATA:  Left hand pain and swelling since a dog bite 6 days ago. EXAM: MRI OF THE LEFT HAND WITHOUT AND WITH CONTRAST TECHNIQUE: Multiplanar, multisequence MR imaging of the left hand was performed before and after the administration of intravenous contrast. CONTRAST:  7 mL GADAVIST IV SOLN COMPARISON:  Plain films left hand today. FINDINGS: Bones/Joint/Cartilage There is mild marrow edema and enhancement in the dorsal margins of the distal capitate and trapezoid bones. Bone marrow signal is otherwise normal. No joint effusion is identified. Ligaments Intact. Muscles  and Tendons There is fluid with edema and enhancement in the compartment 2, 3 and 4 extensor tendons, worst in compartment 4 where there is a large volume of rim enhancing fluid about the tendons at the level of the articulation of the capitate and third metacarpal. Soft tissues Intense subcutaneous edema and enhancement are seen about the dorsum of the hand. There is a rim enhancing fluid collection in the subcutaneous tissues over the dorsum of the hand which measures 4.7 cm transverse by 0.7 cm AP by approximately 6 cm craniocaudal. Craniocaudal extension is most notable over the third and fourth metacarpals. IMPRESSION: Cellulitis of the dorsum of the left hand with a subcutaneous abscess over the metacarpals as described above. Findings consistent with septic tenosynovitis of the compartment 2, 3 and 4 extensor tendons,  worst in compartment 4. Marrow edema and enhancement the dorsal aspects of the capitate and trapezoid bones most consistent with osteomyelitis. Electronically Signed   By: Drusilla Kanner M.D.   On: 05/09/2019 11:35   DG Hand Complete Left  Result Date: 05/09/2019 CLINICAL DATA:  Wound infection after dog bite 6 days ago EXAM: LEFT HAND - COMPLETE 3+ VIEW COMPARISON:  04/29/2019 FINDINGS: Again seen are multiple tiny bony fragments along the dorsal aspect of the hand at the level of the carpometacarpal joints. Marked soft tissue swelling of the dorsum of the hand, progressed from prior. Previously seen soft tissue air has resolved. No cortical destruction or periostitis. No new fractures. No malalignment. IMPRESSION: 1. Marked soft tissue swelling of the dorsum of the hand, progressed from prior. 2. No radiographic evidence of osteomyelitis. 3. Tiny fracture fragments again noted along the dorsal aspect of the CMC joints. Electronically Signed   By: Duanne Guess D.O.   On: 05/09/2019 10:16    Assessment/Plan Principal Problem:   Cellulitis of left hand Active Problems:   Sickle cell trait (HCC)   Hypokalemia   Tobacco abuse   Marijuana abuse   Duodenal ulcer due to Helicobacter pylori    Cellulitis/osteomyelitis of left hand: -Patient had dog bite 10 days ago.  Presented with worsening left hand swelling. -MRI as above. -Patient is afebrile with no leukocytosis, lactic acid: WNL.  Blood culture is obtained and is pending.  COVID-19: Pending -Admit patient on the floor. -Start on IV Vanco and cefepime.  -We will keep him n.p.o.  Morphine as needed for pain control.  Zofran as needed for nausea and vomiting. -EDP consulted orthopedic surgery-recommended surgery this afternoon. -Consulted Occupational Therapy  Hypokalemia: -Replenish potassium.  Check magnesium level -Repeat BMP tomorrow a.m.  Sickle cell trait: H&H is stable -MCV 79.5 -Continue to monitor  History of duodenal  ulcer due to Helicobacter pylori: -Reviewed upper GI endoscopy from 04/03/2011 -Patient received and finished treatment at that time.  No history of melena.  H&H is currently stable.  Tobacco/marijuana abuse: Counseled about cessation.    DVT prophylaxis: TED/SCD Code Status: Full code Family Communication: None present at bedside.  Plan of care discussed with patient in length and he verbalized understanding and agreed with it. Disposition Plan: Likely home in  2 days  consults called: Orthopedic surgery by EDP  admission status: Inpatient   Ollen Bowl MD [facility] Pager [phone]  If 7PM-7AM, please contact night-coverage [email or web address] Password [facility]  05/09/2019, 12:53 PM

## 2019-05-09 NOTE — Op Note (Signed)
PREOPERATIVE DIAGNOSIS: Left dorsal hand abscess with extensor infectious tenosynovitis  POSTOPERATIVE DIAGNOSIS: Same  ATTENDING PHYSICIAN: Maudry Mayhew. Jeannie Fend, III, MD who was present and scrubbed for the entire case   ASSISTANT SURGEON: None.   ANESTHESIA: General  SURGICAL PROCEDURES: 1.  Irrigation treatment of left dorsal hand and wrist abscess 2.  Left radical tenosynovectomy of the second, third and fourth dorsal compartments 3.  Left bone biopsy/saucerization of the dorsal trapezoid  SURGICAL INDICATIONS: Patient is a 44 year old male who presented to the ER approximately 10 days ago.  He sustained a dog bite from 2 dogs to his bilateral hands as well as his head.  He was placed on oral antibiotics and did well initially.  Over the past 2 to 3 days though he had progressive swelling to his left hand and wrist.  He really had minimal pain but did note a continued swelling as well as weakness and dysfunction of the hand.  On exam in the ER he had significant swelling to the hand and a subsequent MRI was obtained which showed extensive tenosynovitis with abscess formation around the second, third and fourth compartments.  Additionally there was concern for osteomyelitis of the capitate and trapezoid.  I had a discussion with him regarding treatment options and did recommend proceeding forward with irrigation debridement of his left dorsal hand.  He did agree to proceed.  FINDING: There was extensive, thick infectious appearing material surrounding the extensor tendons to the second, third and fourth tendons.  Thorough debridement was performed.  There was purulence extending down to the dorsal trapezoid which had exposed cancellous bone.  This was debrided and bone was sent for cultures as well.  Successful I&D was accomplished as well as Tina synovectomy and the wound was closed over top of the drain.  DESCRIPTION OF PROCEDURE: The patient was identified in the preop holding area where the  risk benefits and alternatives of the procedure were discussed with the patient.  This includes but is not limited to continued infection, bleeding, damage to surrounding structures including blood vessels and nerves, pain, stiffness and need for additional procedures.  Informed consent was obtained at that time the patient's left hand was marked with a surgical marking pen.  He was then brought back to the operative suite where timeout was performed identifying the correct patient operative site.  He was positioned supine on the operative table with his hand outstretched on a hand table.  A tourniquet is placed on the upper arm.  He was then induced under general anesthesia.  The left upper extremity was then prepped and draped in usual sterile fashion.  The limb was exsanguinated by gravity and the tourniquet was inflated to 250 mmHg.  A dorsal, longitudinal incision was made over the hand and wrist.  Blunt dissection was carried down through subcutaneous tissues.  The extensor tendons were identified and found to be complete lately encased in infectious, thick tenosynovium.  This area was cultured for both aerobic and aerobic bacteria.  This area was then thoroughly debrided sharply with pickups and scissors as well as with a rondure.  Debridement include extensor tenosynovectomy to the second, third and fourth compartments.  The extensor retinaculum was protected and debridement was performed through all tendons proximal and distal to the extensor retinaculum.  Continued dissection was performed deep to the second compartment.  There was continued purulence that tracked down to the dorsal trapezoid.  There was exposed bone in this area with exposed cancellous bone.  This  was debrided using a curette and rondure and a sample of bone was sent for a bone culture.  At this point the wound was then copiously irrigated with 3 L of normal saline by cystoscopy tubing.  The wound was then loosely closed over top of  a Penrose drain.  Xeroform, 4 x 4's, Kerlix and a well-padded soft dressing were then applied.  The tourniquet was released and the patient had return of brisk capillary refill to all of his digits.  He was awoken from his anesthesia and extubated in the operating room without any complications.  He was taken to the PACU in stable condition.  ESTIMATED BLOOD LOSS: 30 mL  TOURNIQUET TIME: 38 minutes  SPECIMENS: Aerobic and anaerobic cultures.  Dorsal trapezoid bone culture  POSTOPERATIVE PLAN: Patient will be transferred back up to the hospital floor where he will continue service of the hospitalist service.  Recommend a infectious disease consult for continued, long-term antibiotics.  Plan to leave his drain in place for approximately 1 to 2 days monitor his clinical progression.  He can begin early digit and wrist range of motion once his pain allows.  IMPLANTS: None

## 2019-05-10 LAB — CBC
HCT: 34.8 % — ABNORMAL LOW (ref 39.0–52.0)
Hemoglobin: 12.3 g/dL — ABNORMAL LOW (ref 13.0–17.0)
MCH: 28.1 pg (ref 26.0–34.0)
MCHC: 35.3 g/dL (ref 30.0–36.0)
MCV: 79.5 fL — ABNORMAL LOW (ref 80.0–100.0)
Platelets: 374 10*3/uL (ref 150–400)
RBC: 4.38 MIL/uL (ref 4.22–5.81)
RDW: 13.3 % (ref 11.5–15.5)
WBC: 11.6 10*3/uL — ABNORMAL HIGH (ref 4.0–10.5)
nRBC: 0 % (ref 0.0–0.2)

## 2019-05-10 LAB — COMPREHENSIVE METABOLIC PANEL
ALT: 15 U/L (ref 0–44)
AST: 12 U/L — ABNORMAL LOW (ref 15–41)
Albumin: 2.9 g/dL — ABNORMAL LOW (ref 3.5–5.0)
Alkaline Phosphatase: 47 U/L (ref 38–126)
Anion gap: 11 (ref 5–15)
BUN: 9 mg/dL (ref 6–20)
CO2: 23 mmol/L (ref 22–32)
Calcium: 8.4 mg/dL — ABNORMAL LOW (ref 8.9–10.3)
Chloride: 105 mmol/L (ref 98–111)
Creatinine, Ser: 1.01 mg/dL (ref 0.61–1.24)
GFR calc Af Amer: >60 mL/min (ref >60)
GFR calc non Af Amer: >60 mL/min (ref >60)
Glucose, Bld: 134 mg/dL — ABNORMAL HIGH (ref 70–99)
Potassium: 4.3 mmol/L (ref 3.5–5.1)
Sodium: 139 mmol/L (ref 135–145)
Total Bilirubin: 0.5 mg/dL (ref 0.3–1.2)
Total Protein: 5.8 g/dL — ABNORMAL LOW (ref 6.5–8.1)

## 2019-05-10 MED ORDER — PANTOPRAZOLE SODIUM 40 MG PO TBEC
40.0000 mg | DELAYED_RELEASE_TABLET | Freq: Every day | ORAL | Status: DC
  Administered 2019-05-10 – 2019-05-15 (×6): 40 mg via ORAL
  Filled 2019-05-10 (×6): qty 1

## 2019-05-10 MED ORDER — IBUPROFEN 400 MG PO TABS: 400.0000 mg | ORAL_TABLET | Freq: Four times a day (QID) | ORAL | Status: DC | PRN

## 2019-05-10 NOTE — Progress Notes (Signed)
   Ortho Hand Progress Note  Subjective: No acute events last night. Pain controlled.   Objective: Vital signs in last 24 hours: Temp:  [97.8 F (36.6 C)-99.3 F (37.4 C)] 97.8 F (36.6 C) (03/18 0806) Pulse Rate:  [62-108] 66 (03/18 0806) Resp:  [13-24] 18 (03/18 0806) BP: (111-144)/(73-96) 114/85 (03/18 0806) SpO2:  [96 %-100 %] 100 % (03/18 0806) Weight:  [65.8 kg] 65.8 kg (03/17 2026)  Intake/Output from previous day: 03/17 0701 - 03/18 0700 In: 2093.3 [P.O.:360; I.V.:1281.2; IV Piggyback:452.1] Out: 1315 [Urine:1300; Blood:15] Intake/Output this shift: Total I/O In: 540 [P.O.:540] Out: 600 [Urine:600]  Recent Labs    05/08/19 2337 05/10/19 0219  HGB 13.4 12.3*   Recent Labs    05/08/19 2337 05/10/19 0219  WBC 9.3 11.6*  RBC 4.69 4.38  HCT 37.3* 34.8*  PLT 393 374   Recent Labs    05/08/19 2337 05/10/19 0219  NA 142 139  K 3.1* 4.3  CL 108 105  CO2 24 23  BUN 12 9  CREATININE 1.05 1.01  GLUCOSE 103* 134*  CALCIUM 8.6* 8.4*   No results for input(s): LABPT, INR in the last 72 hours.  Aaox3 nad Resp nonlabored RRR LUE: continued, though decreased swelling to the left hand. Intact digit flex/ex but decreased secondary to pain. Dorsal incision with serosang output, no purulent drainage. Sutures intact. Fingers wwp with bcr. 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 Appreciate management - Continue abx. Recommend ID consult for long term abx plan and osteomyelitis treatment - continue LUE elevation, regular digit ROM - Daily dressing changes per nursing - Cxs: NGTD - drains pulled  OK for d/c from hand standpoint once abx plan has been determined and cleared by medicine. Follow up in 1-2 weeks   Cain Saupe  III 05/10/2019, 12:17 PM  [phone]

## 2019-05-10 NOTE — Progress Notes (Signed)
PROGRESS NOTE    TAWAN DEGROOTE  ONG:[id number] DOB: 1975-12-22 DOA: 05/08/2019 PCP: Patient, No Pcp Per    Brief Narrative:  Patient was admitted to the hospital with the working diagnosis of left hand cellulitis, tenosynovitis and capitate/episode bones osteomyelitis.   44 year old male who presented with left hand edema.  He does have significant past medical history for tobacco abuse, marijuana abuse, sickle cell trait, and history of duodenal ulcers.  He presented with 10 days of left hand edema, apparently he suffered dog bites in his left hand, and face.  At that time his lacerations were repaired and he received Augmentin for antibiotic therapy.  For the last 48 hours his left hand has become more edematous and tender.  On his initial physical examination his blood pressure was 135/85, heart rate 81, respiratory rate 16, temperature 98.6, oxygen saturation 99%.  His lungs are clear to auscultation bilaterally, heart S1-S2 present rhythmic, his abdomen was soft, no lower extremity edema, his left hand was edematous, tender to palpation.  Sodium 142, potassium 3.1, chloride 108, bicarb 24, glucose 103, BUN 12, creatinine 1.0, white count 9.3, hemoglobin 13.4, hematocrit 37.3, platelets 393.  SARS COVID-19 negative.    Left hand x-ray with moderate soft tissue swelling of the dorsum of the left hand, progressed from prior imaging.  No radiographic evidence of osteomyelitis.  Left hand MRI with subcutaneous abscess over the metacarpals.  Septic tenosynovitis of the compartment 2, 3 and 4 extensors, worse in compartment 4.  Marrow edema and enhancement of the dorsal aspects of the capitate and trapezoid bones most consistent with osteomyelitis.    Assessment & Plan:   Principal Problem:   Cellulitis of left hand Active Problems:   Sickle cell trait (HCC)   Hypokalemia   Tobacco abuse   Marijuana abuse   Duodenal ulcer due to Helicobacter pylori   1. Left hand cellulitis, tenosynovitis,  and capitate/episode bones osteomyelitis/ sp surgical irrigation, tenosynovectomy and bone biopsy. This am continue to have edema, but pain has improved, no nausea or vomiting. Wbc is 11,6, cultures continue with no growth, patient has been afebrile.   Will continue antibiotic therapy with IV cefepime and vancomycin. Follow on cultures, cell count and temperature curve. Continue pain control with morphine and acetaminophen, will add ibuprofen.   2. Hypokalemia. Serum k is up to 4,3 with preserved renal function with serum cr at 1.0 wit serum bicarbonate at 23. Patient is tolerating po well, will dc IV fluids for now and will follow on renal panel in am.   3. Tobacco abuse. Smoking cessation counseling.   4. Hx of peptic ulcer disease (helicobacter pylori) will add pantoprazole for GI prophylaxis.    DVT prophylaxis: enoxaparin   Code Status:  full Family Communication: no family at the bedside  Disposition Plan/ discharge barriers: patient from home, barrier for dc recent surgery, need for post op nursing care. Pending surgery recommendations for dc.    Consultants:   Orthopedics   Procedures:  1.  Irrigation treatment of left dorsal hand and wrist abscess 2.  Left radical tenosynovectomy of the second, third and fourth dorsal compartments 3.  Left bone biopsy/saucerization of the dorsal trapezoid  Antimicrobials:   Cefepime and vancomycin    Subjective: Patient continue to have edema and decrease range of motion on left hand fingers, pain is well controlled, with no nausea or vomiting, tolerating po well,   Objective: Vitals:   05/09/19 2026 05/10/19 0304 05/10/19 0620 05/10/19 0806  BP:  129/83 111/73 115/73 114/85  Pulse: 73 66 63 66  Resp: 18 17 18 18   Temp: 98.5 F (36.9 C) 98.6 F (37 C) 99.3 F (37.4 C) 97.8 F (36.6 C)  TempSrc: Oral Oral Oral Oral  SpO2: 98% 98%  100%  Weight: 65.8 kg     Height:        Intake/Output Summary (Last 24 hours) at 05/10/2019  0853 Last data filed at 05/10/2019 0817 Gross per 24 hour  Intake 2633.3 ml  Output 1765 ml  Net 868.3 ml   Filed Weights   05/09/19 1751 05/09/19 2026  Weight: 65.8 kg 65.8 kg    Examination:   General: Not in pain or dyspnea/  Neurology: Awake and alert, non focal  E ENT: no pallor, no icterus, oral mucosa moist Cardiovascular: No JVD. S1-S2 present, rhythmic, no gallops, rubs, or murmurs. No lower extremity edema. Pulmonary: vesicular breath sounds bilaterally, adequate air movement, no wheezing, rhonchi or rales. Gastrointestinal. Abdomen with, no organomegaly, non tender, no rebound or guarding Skin. No rashes Musculoskeletal: left hand with dressing in place, positive edema and decrease range of motion due to pain end edema,     Data Reviewed: I have personally reviewed following labs and imaging studies  CBC: Recent Labs  Lab 05/08/19 2337 05/10/19 0219  WBC 9.3 11.6*  NEUTROABS 5.6  --   HGB 13.4 12.3*  HCT 37.3* 34.8*  MCV 79.5* 79.5*  PLT 393 025   Basic Metabolic Panel: Recent Labs  Lab 05/08/19 2337 05/09/19 1444 05/10/19 0219  NA 142  --  139  K 3.1*  --  4.3  CL 108  --  105  CO2 24  --  23  GLUCOSE 103*  --  134*  BUN 12  --  9  CREATININE 1.05  --  1.01  CALCIUM 8.6*  --  8.4*  MG  --  2.0  --    GFR: Estimated Creatinine Clearance: 86 mL/min (by C-G formula based on SCr of 1.01 mg/dL). Liver Function Tests: Recent Labs  Lab 05/08/19 2337 05/10/19 0219  AST 17 12*  ALT 16 15  ALKPHOS 54 47  BILITOT 0.4 0.5  PROT 6.7 5.8*  ALBUMIN 3.4* 2.9*   No results for input(s): LIPASE, AMYLASE in the last 168 hours. No results for input(s): AMMONIA in the last 168 hours. Coagulation Profile: No results for input(s): INR, PROTIME in the last 168 hours. Cardiac Enzymes: No results for input(s): CKTOTAL, CKMB, CKMBINDEX, TROPONINI in the last 168 hours. BNP (last 3 results) No results for input(s): PROBNP in the last 8760 hours. HbA1C: No  results for input(s): HGBA1C in the last 72 hours. CBG: No results for input(s): GLUCAP in the last 168 hours. Lipid Profile: No results for input(s): CHOL, HDL, LDLCALC, TRIG, CHOLHDL, LDLDIRECT in the last 72 hours. Thyroid Function Tests: No results for input(s): TSH, T4TOTAL, FREET4, T3FREE, THYROIDAB in the last 72 hours. Anemia Panel: No results for input(s): VITAMINB12, FOLATE, FERRITIN, TIBC, IRON, RETICCTPCT in the last 72 hours.    Radiology Studies: I have reviewed all of the imaging during this hospital visit personally     Scheduled Meds: Continuous Infusions: . 0.9 % NaCl with KCl 40 mEq / L 100 mL/hr at 05/10/19 0540  . ceFEPime (MAXIPIME) IV 2 g (05/10/19 0828)  . vancomycin Stopped (05/10/19 0353)     LOS: 1 day        Daelin Haste Gerome Apley, MD

## 2019-05-10 NOTE — Evaluation (Addendum)
Occupational Therapy Evaluation Patient Details Name: David Galloway MRN: [id number] DOB: 07-12-1975 Today's Date: 05/10/2019    History of Present Illness Pt is a 44 y/o male with PMH of tobacco abuse, marijuana abuse, sickle cell trait, duodenal ulcer due to  Helicobacter pylori presents to emergency department due to left hand swelling for 10 days (reporting being attacked by a dog). S/P I&D L dorsal hand and wrist abscess, L radical tenosynovectomy of 2nd, 3rd and 4th dorsal compartments, L bone biopsy of dorsal trapezoid. Per ortho early digit and wrist ROM as tolerated.    Clinical Impression   PTA patient independent and working.  Limited by L  hand edema, pain, decreased sensation and decreased functional use.  Completing ADLs with modified independence using 1 handed technique, educated on compensatory techniques for ease due to frustrations from increased time with ADLs and progressive use of L UE functionally as movement, sensation and strength improve. Patient educated on importance of elevation, use of ice and exercises.  Able to complete minimal AROM of digit flexion/extension, ab/adduction, opposition (to only digits 2/3), and full supination/pronation (no wrist movement today due to pain/post op wrap).  Will follow acutely to optimize L hand functional use and ROM, with recommendations to follow up with OP OT when ready per surgeon.      Follow Up Recommendations  Follow surgeon's recommendation for DC plan and follow-up therapies;Other (comment)(OP OT for L hand per surgeon )    Equipment Recommendations  None recommended by OT    Recommendations for Other Services       Precautions / Restrictions Precautions Precautions: None Restrictions Weight Bearing Restrictions: No      Mobility Bed Mobility Overal bed mobility: Modified Independent             General bed mobility comments: HOB elevated but no physical assist   Transfers Overall transfer level: Modified  independent                    Balance Overall balance assessment: No apparent balance deficits (not formally assessed)                                         ADL either performed or assessed with clinical judgement   ADL Overall ADL's : Modified independent                                     Functional mobility during ADLs: Modified independent General ADL Comments: pt demonstrating ability to complete ADLs with modified independence using 1 handed techniques, educated on compensatory techniques and progressive functional use of L UE as able during ADLs      Vision         Perception     Praxis      Pertinent Vitals/Pain Pain Assessment: Faces Faces Pain Scale: Hurts a little bit Pain Location: L hand with movement  Pain Descriptors / Indicators: Discomfort;Sore;Operative site guarding Pain Intervention(s): Limited activity within patient's tolerance;Monitored during session;Repositioned(encouraged elevation and ice use )     Hand Dominance Right   Extremity/Trunk Assessment Upper Extremity Assessment Upper Extremity Assessment: LUE deficits/detail LUE Deficits / Details: S/P I&D L hand- edema noted; ace wrap post op-- limited finger ROM and no wrist ROM due to pain; WFL forearm and proximal  LUE Sensation:  decreased light touch LUE Coordination: decreased fine motor   Lower Extremity Assessment Lower Extremity Assessment: Defer to PT evaluation       Communication Communication Communication: No difficulties   Cognition Arousal/Alertness: Awake/alert Behavior During Therapy: WFL for tasks assessed/performed Overall Cognitive Status: Within Functional Limits for tasks assessed                                     General Comments  educated on exercises to L UE (focus on finger flexion, ab/adduction and forearm supination/pronation without shoulder compensatory movements); educated on importance of ice and  elevation     Exercises Exercises: Hand exercises Hand Exercises Forearm Supination: AROM;Left;10 reps;Seated Forearm Pronation: AROM;Left;10 reps;Seated Wrist Flexion: (unable to do post op wrap and pain ) Wrist Extension: (unable to do post op wrap and pain ) Digit Composite Flexion: AROM;Left;10 reps;Seated Composite Extension: AROM;10 reps;Left;Seated Digit Composite Abduction: AROM;Left;Seated;5 reps Digit Composite Adduction: AROM;Right;5 reps;Seated Thumb Abduction: AROM;Left;5 reps;Seated(flexion/extension too ) Thumb Adduction: AROM;Left;5 reps;Seated Opposition: AROM;Left;5 reps;Seated(limited to digits 2&3 only)   Shoulder Instructions      Home Living Family/patient expects to be discharged to:: Private residence Living Arrangements: Alone   Type of Home: House Home Access: Stairs to enter Technical brewer of Steps: 4   Home Layout: One level     Bathroom Shower/Tub: Teacher, early years/pre: [place]: None          Prior Functioning/Environment Level of Independence: Independent        Comments: prep cook at [employer]         OT Problem List: Decreased strength;Decreased range of motion;Decreased coordination;Pain;Impaired UE functional use;Increased edema      OT Treatment/Interventions: Self-care/ADL training;Therapeutic exercise;Therapeutic activities    OT Goals(Current goals can be found in the care plan section) Acute Rehab OT Goals Patient Stated Goal: to get my L hand feeling better OT Goal Formulation: With patient Time For Goal Achievement: 05/24/19 Potential to Achieve Goals: Good  OT Frequency: Min 2X/week   Barriers to D/C:            Co-evaluation              AM-PAC OT "6 Clicks" Daily Activity     Outcome Measure Help from another person eating meals?: None Help from another person taking care of personal grooming?: None Help from another person toileting, which includes using  toliet, bedpan, or urinal?: None Help from another person bathing (including washing, rinsing, drying)?: None Help from another person to put on and taking off regular upper body clothing?: None Help from another person to put on and taking off regular lower body clothing?: None 6 Click Score: 24   End of Session Nurse Communication: Mobility status;Precautions  Activity Tolerance: Patient tolerated treatment well Patient left: with call bell/phone within reach;Other (comment)(up in room)  OT Visit Diagnosis: Pain;Muscle weakness (generalized) (M62.81) Pain - Right/Left: Left Pain - part of body: Arm;Hand                Time: 3244-0102 OT Time Calculation (min): 19 min Charges:  OT General Charges $OT Visit: 1 Visit OT Evaluation $OT Eval Low Complexity: 1 Low  Jolaine Artist, OT Acute Rehabilitation Services Pager [phone] Office [phone]    Delight Stare 05/10/2019, 10:03 AM

## 2019-05-10 NOTE — TOC Initial Note (Signed)
Transition of Care [facility]) - Initial/Assessment Note    Patient Details  Name: David Galloway MRN: [id number] Date of Birth: [birth year]  [facility] CM/SW Contact:    Kingsley Plan, RN Phone Number: 05/10/2019, 12:44 PM  Clinical Narrative:                 Confirmed face sheet information . Patient lives by self, and states he has family close by who can help if needed.   Explained he may need IV ABX at home. Explained PICC line. NCM will call home health agencies for [facility].Patient aware he will be taught how to give IV ABX at home. HHRN cannot make daily visits.  Patient is not insured. Will assist with PO medications at discharge and use [facility] pharmacy if patient discharges on a week day.   Spoke to [place] with [facility] and left message for [facility] with [facility].   Expected Discharge Plan: Home w Home Health Services Barriers to Discharge: Continued Medical Work up   Patient Goals and CMS Choice Patient states their goals for this hospitalization and ongoing recovery are:: to return to home CMS Medicare.gov Compare Post Acute Care list provided to:: Patient Choice offered to / list presented to : Patient  Expected Discharge Plan and Services Expected Discharge Plan: Home w Home Health Services In-house Referral: Financial Counselor Discharge Planning Services: CM Consult Post Acute Care Choice: Home Health Living arrangements for the past 2 months: Single Family Home                 DME Arranged: N/A         HH Arranged: NA          Prior Living Arrangements/Services Living arrangements for the past 2 months: Single Family Home Lives with:: Self Patient language and need for interpreter reviewed:: Yes Do you feel safe going back to the place where you live?: Yes      Need for Family Participation in Patient Care: Yes (Comment) Care giver support system in place?: Yes (comment) Current home services: Home RN Criminal Activity/Legal Involvement  Pertinent to Current Situation/Hospitalization: No - Comment as needed  Activities of Daily Living Home Assistive Devices/Equipment: None ADL Screening (condition at time of admission) Patient's cognitive ability adequate to safely complete daily activities?: Yes Is the patient deaf or have difficulty hearing?: No Does the patient have difficulty seeing, even when wearing glasses/contacts?: No Does the patient have difficulty concentrating, remembering, or making decisions?: No Patient able to express need for assistance with ADLs?: Yes Does the patient have difficulty dressing or bathing?: No Independently performs ADLs?: Yes (appropriate for developmental age) Does the patient have difficulty walking or climbing stairs?: No Weakness of Legs: None Weakness of Arms/Hands: Left(s/p left hand surgery)  Permission Sought/Granted   Permission granted to share information with : No              Emotional Assessment Appearance:: Appears stated age Attitude/Demeanor/Rapport: Engaged Affect (typically observed): Accepting Orientation: : Oriented to Self, Oriented to Place, Oriented to  Time, Oriented to Situation Alcohol / Substance Use: Not Applicable Psych Involvement: No (comment)  Admission diagnosis:  Hand swelling [M79.89] Wound infection [T14.8XXA, L08.9] Cellulitis of left hand [L03.114] Dog bite of left hand, initial encounter [S61.452A, W54.0XXA] Infection of left hand [L08.9] Patient Active Problem List   Diagnosis Date Noted  . Cellulitis of left hand 05/09/2019  . Tobacco abuse 05/09/2019  . Marijuana abuse 05/09/2019  . Sickle cell  trait ([place])   . Hypokalemia   . Duodenal ulcer due to Helicobacter pylori   . Anemia 03/30/2011  . Duodenal ulcer 03/30/2011   PCP:  Patient, No Pcp Per Pharmacy:   [facility] [place], [place] [address] Phone:  [phone] Fax: [phone]  [facility] [place], [place] [address] [place] [address] Phone: [phone] Fax: [phone]     Social Determinants of Health (SDOH) Interventions    Readmission Risk Interventions No flowsheet data found.

## 2019-05-11 ENCOUNTER — Inpatient Hospital Stay: Payer: Self-pay

## 2019-05-11 DIAGNOSIS — F1210 Cannabis abuse, uncomplicated: Secondary | ICD-10-CM

## 2019-05-11 DIAGNOSIS — M869 Osteomyelitis, unspecified: Secondary | ICD-10-CM

## 2019-05-11 DIAGNOSIS — M7989 Other specified soft tissue disorders: Secondary | ICD-10-CM

## 2019-05-11 DIAGNOSIS — L089 Local infection of the skin and subcutaneous tissue, unspecified: Secondary | ICD-10-CM

## 2019-05-11 DIAGNOSIS — Z720 Tobacco use: Secondary | ICD-10-CM

## 2019-05-11 DIAGNOSIS — L03114 Cellulitis of left upper limb: Principal | ICD-10-CM

## 2019-05-11 DIAGNOSIS — K269 Duodenal ulcer, unspecified as acute or chronic, without hemorrhage or perforation: Secondary | ICD-10-CM

## 2019-05-11 DIAGNOSIS — T148XXA Other injury of unspecified body region, initial encounter: Secondary | ICD-10-CM

## 2019-05-11 DIAGNOSIS — B9561 Methicillin susceptible Staphylococcus aureus infection as the cause of diseases classified elsewhere: Secondary | ICD-10-CM

## 2019-05-11 DIAGNOSIS — M65142 Other infective (teno)synovitis, left hand: Secondary | ICD-10-CM

## 2019-05-11 DIAGNOSIS — D573 Sickle-cell trait: Secondary | ICD-10-CM

## 2019-05-11 DIAGNOSIS — W540XXA Bitten by dog, initial encounter: Secondary | ICD-10-CM

## 2019-05-11 DIAGNOSIS — Z87891 Personal history of nicotine dependence: Secondary | ICD-10-CM

## 2019-05-11 DIAGNOSIS — S61452A Open bite of left hand, initial encounter: Secondary | ICD-10-CM

## 2019-05-11 DIAGNOSIS — E876 Hypokalemia: Secondary | ICD-10-CM

## 2019-05-11 DIAGNOSIS — B9681 Helicobacter pylori [H. pylori] as the cause of diseases classified elsewhere: Secondary | ICD-10-CM

## 2019-05-11 LAB — RAPID URINE DRUG SCREEN, HOSP PERFORMED
Amphetamines: NOT DETECTED
Barbiturates: NOT DETECTED
Benzodiazepines: NOT DETECTED
Cocaine: NOT DETECTED
Opiates: NOT DETECTED
Tetrahydrocannabinol: POSITIVE — AB

## 2019-05-11 NOTE — Progress Notes (Signed)
   Ortho Hand Progress Note  Subjective: No acute events last night.  Objective: Vital signs in last 24 hours: Temp:  [97.8 F (36.6 C)-98.7 F (37.1 C)] 98.7 F (37.1 C) (03/19 0411) Pulse Rate:  [60-75] 60 (03/19 0411) Resp:  [18] 18 (03/19 0411) BP: (114-126)/(80-92) 126/92 (03/19 0411) SpO2:  [98 %-100 %] 98 % (03/19 0411)  Intake/Output from previous day: 03/18 0701 - 03/19 0700 In: 1450 [P.O.:900; IV Piggyback:550] Out: 1600 [Urine:1600] Intake/Output this shift: No intake/output data recorded.  Recent Labs    05/08/19 2337 05/10/19 0219  HGB 13.4 12.3*   Recent Labs    05/08/19 2337 05/10/19 0219  WBC 9.3 11.6*  RBC 4.69 4.38  HCT 37.3* 34.8*  PLT 393 374   Recent Labs    05/08/19 2337 05/10/19 0219  NA 142 139  K 3.1* 4.3  CL 108 105  CO2 24 23  BUN 12 9  CREATININE 1.05 1.01  GLUCOSE 103* 134*  CALCIUM 8.6* 8.4*   No results for input(s): LABPT, INR in the last 72 hours.  Aaox3 nad Resp nonlabored RRR LUE: stable swelling to the left hand. Improved digit flex/ex but still decreased. Dorsal incision with serosang output, no purulent drainage. Sutures intact. Fingers wwp with bcr. 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 Appreciate management - Continue abx. Recommend ID consult for long term abx plan and osteomyelitis treatment - continue LUE elevation, regular digit ROM - Daily dressing changes per nursing - Cxs: NGTD  OK for d/c from hand standpoint once abx plan has been determined and cleared by medicine. Follow up in 1 week   David Galloway 05/11/2019, 7:32 AM  [phone]

## 2019-05-11 NOTE — Evaluation (Signed)
Physical Therapy Evaluation and Discharge Patient Details Name: David Galloway MRN: [id number] DOB: 12/10/1975 Today's Date: 05/11/2019   History of Present Illness  Pt is a 44 y/o male with PMH of tobacco abuse, marijuana abuse, sickle cell trait, duodenal ulcer due to  Helicobacter pylori presents to emergency department due to left hand swelling for 10 days (reporting being attacked by a dog). S/P I&D L dorsal hand and wrist abscess, L radical tenosynovectomy of 2nd, 3rd and 4th dorsal compartments, L bone biopsy of dorsal trapezoid. Per ortho early digit and wrist ROM as tolerated.   Clinical Impression  Patient evaluated by Physical Therapy with no further acute PT needs identified. All education has been completed and the patient has no further questions. Pt functioning at a mod I to independent level for gait and stair navigation. Scored 24 on DGI indicating low fall risk. Per notes, plans to follow up with outpatient OT. Educated pt about importance of follow up OT in regaining functional use of L hand. See below for any follow-up Physical Therapy or equipment needs. PT is signing off. Thank you for this referral. If needs change, please re-consult.      Follow Up Recommendations No PT follow up    Equipment Recommendations  None recommended by PT    Recommendations for Other Services       Precautions / Restrictions Precautions Precautions: None Restrictions Weight Bearing Restrictions: No      Mobility  Bed Mobility Overal bed mobility: Independent                Transfers Overall transfer level: Independent                  Ambulation/Gait Ambulation/Gait assistance: Independent Gait Distance (Feet): 250 Feet Assistive device: None Gait Pattern/deviations: WFL(Within Functional Limits) Gait velocity: WFL    General Gait Details: Good gait speed with no LOB noted when performing tasks from DGI.   Stairs Stairs: Yes Stairs assistance: Modified  independent (Device/Increase time) Stair Management: One rail Right;Alternating pattern;Forwards Number of Stairs: 6 General stair comments: Steady stair navigation. No physical assist required. Pt using rail.   Wheelchair Mobility    Modified Rankin (Stroke Patients Only)       Balance Overall balance assessment: Independent                               Standardized Balance Assessment Standardized Balance Assessment : Dynamic Gait Index   Dynamic Gait Index Level Surface: Normal Change in Gait Speed: Normal Gait with Horizontal Head Turns: Normal Gait with Vertical Head Turns: Normal Gait and Pivot Turn: Normal Step Over Obstacle: Normal Step Around Obstacles: Normal Steps: Normal Total Score: 24       Pertinent Vitals/Pain Pain Assessment: Faces Faces Pain Scale: Hurts a little bit Pain Location: L hand with movement  Pain Descriptors / Indicators: Discomfort;Sore;Operative site guarding Pain Intervention(s): Limited activity within patient's tolerance;Monitored during session;Repositioned    Home Living Family/patient expects to be discharged to:: Private residence Living Arrangements: Alone Available Help at Discharge: Family;Available PRN/intermittently Type of Home: House Home Access: Stairs to enter Entrance Stairs-Rails: Left Entrance Stairs-Number of Steps: 5 Home Layout: One level Home Equipment: None      Prior Function Level of Independence: Independent         Comments: prep cook at [employer]      Hand Dominance   Dominant Hand: Left    Extremity/Trunk  Assessment   Upper Extremity Assessment Upper Extremity Assessment: Defer to OT evaluation    Lower Extremity Assessment Lower Extremity Assessment: Overall WFL for tasks assessed    Cervical / Trunk Assessment Cervical / Trunk Assessment: Normal  Communication   Communication: No difficulties  Cognition Arousal/Alertness: Awake/alert Behavior During Therapy: WFL for  tasks assessed/performed Overall Cognitive Status: No family/caregiver present to determine baseline cognitive functioning                                        General Comments      Exercises     Assessment/Plan    PT Assessment Patent does not need any further PT services  PT Problem List         PT Treatment Interventions      PT Goals (Current goals can be found in the Care Plan section)  Acute Rehab PT Goals Patient Stated Goal: to get my L hand feeling better PT Goal Formulation: With patient Time For Goal Achievement: 05/11/19 Potential to Achieve Goals: Good    Frequency     Barriers to discharge        Co-evaluation               AM-PAC PT "6 Clicks" Mobility  Outcome Measure Help needed turning from your back to your side while in a flat bed without using bedrails?: None Help needed moving from lying on your back to sitting on the side of a flat bed without using bedrails?: None Help needed moving to and from a bed to a chair (including a wheelchair)?: None Help needed standing up from a chair using your arms (e.g., wheelchair or bedside chair)?: None Help needed to walk in hospital room?: None Help needed climbing 3-5 steps with a railing? : None 6 Click Score: 24    End of Session Equipment Utilized During Treatment: Gait belt Activity Tolerance: Patient tolerated treatment well Patient left: in bed;with call bell/phone within reach Nurse Communication: Mobility status PT Visit Diagnosis: Pain Pain - Right/Left: Left Pain - part of body: Hand    Time: 2353-6144 PT Time Calculation (min) (ACUTE ONLY): 15 min   Charges:   PT Evaluation $PT Eval Low Complexity: 1 Low          Cindee Salt, DPT  Acute Rehabilitation Services  Pager: [phone] Office: [phone]   Lehman Prom 05/11/2019, 3:46 PM

## 2019-05-11 NOTE — Anesthesia Postprocedure Evaluation (Signed)
Anesthesia Post Note  Patient: RYDER MAN  Procedure(s) Performed: IRRIGATION AND DEBRIDEMENT EXTREMITY (Left )     Patient location during evaluation: PACU Anesthesia Type: General Level of consciousness: awake and alert Pain management: pain level controlled Vital Signs Assessment: post-procedure vital signs reviewed and stable Respiratory status: spontaneous breathing, nonlabored ventilation, respiratory function stable and patient connected to nasal cannula oxygen Cardiovascular status: blood pressure returned to baseline and stable Postop Assessment: no apparent nausea or vomiting Anesthetic complications: no    Last Vitals:  Vitals:   05/11/19 0411 05/11/19 1531  BP: (!) 126/92 112/84  Pulse: 60 68  Resp: 18 18  Temp: 37.1 C 36.9 C  SpO2: 98% 99%    Last Pain:  Vitals:   05/11/19 1531  TempSrc: Oral  PainSc:                  Yenni Carra

## 2019-05-11 NOTE — Progress Notes (Signed)
PROGRESS NOTE    David Galloway  WUJ:[id number] DOB: May 09, 1975 DOA: 05/08/2019 PCP: Patient, No Pcp Per     Brief Narrative:  44 year old male PMHx Tobacco abuse, Marijuana abuse, Sickle cell trait, Hx Duodenal ulcers.   Presented with left hand edema.   He presented with 10 days of left hand edema, apparently he suffered dog bites in his left hand, and face.  At that time his lacerations were repaired and he received Augmentin for antibiotic therapy.  For the last 48 hours his left hand has become more edematous and tender.  On his initial physical examination his blood pressure was 135/85, heart rate 81, respiratory rate 16, temperature 98.6, oxygen saturation 99%.  His lungs are clear to auscultation bilaterally, heart S1-S2 present rhythmic, his abdomen was soft, no lower extremity edema, his left hand was edematous, tender to palpation.  Sodium 142, potassium 3.1, chloride 108, bicarb 24, glucose 103, BUN 12, creatinine 1.0, white count 9.3, hemoglobin 13.4, hematocrit 37.3, platelets 393.  SARS COVID-19 negative.    Left hand x-ray with moderate soft tissue swelling of the dorsum of the left hand, progressed from prior imaging.  No radiographic evidence of osteomyelitis.  Left hand MRI with subcutaneous abscess over the metacarpals.  Septic tenosynovitis of the compartment 2, 3 and 4 extensors, worse in compartment 4.  Marrow edema and enhancement of the dorsal aspects of the capitate and trapezoid bones most consistent with osteomyelitis.   Subjective: 44 year old BM PMHx Tobacco abuse, Marijuana abuse, Sickle cell trait, Hx Duodenal ulcers.  Presented with left hand edema.   He presented with 10 days of left hand edema, apparently he suffered dog bites in his left hand, and face.  At that time his lacerations were repaired and he received Augmentin for antibiotic therapy.  For the last 48 hours his left hand has become more edematous and tender.  On his initial physical examination his  blood pressure was 135/85, heart rate 81, respiratory rate 16, temperature 98.6, oxygen saturation 99%.  His lungs are clear to auscultation bilaterally, heart S1-S2 present rhythmic, his abdomen was soft, no lower extremity edema, his left hand was edematous, tender to palpation.  Sodium 142, potassium 3.1, chloride 108, bicarb 24, glucose 103, BUN 12, creatinine 1.0, white count 9.3, hemoglobin 13.4, hematocrit 37.3, platelets 393.  SARS COVID-19 negative.    Left hand x-ray with moderate soft tissue swelling of the dorsum of the left hand, progressed from prior imaging.  No radiographic evidence of osteomyelitis.  Left hand MRI with subcutaneous abscess over the metacarpals.  Septic tenosynovitis of the compartment 2, 3 and 4 extensors, worse in compartment 4.  Marrow edema and enhancement of the dorsal aspects of the capitate and trapezoid bones most consistent with osteomyelitis.   Assessment & Plan:   Principal Problem:   Cellulitis of left hand Active Problems:   Sickle cell trait (HCC)   Hypokalemia   Tobacco abuse   Marijuana abuse   Duodenal ulcer due to Helicobacter pylori  Left hand cellulitis, tenosynovitis, and capitate/episode bones osteomyelitis/ sp surgical irrigation, tenosynovectomy and bone biopsy. This am continue to have edema, but pain has improved, no nausea or vomiting. Wbc is 11,6, cultures continue with no growth, patient has been afebrile.   Will continue antibiotic therapy with IV cefepime and vancomycin. Follow on cultures, cell count and temperature curve. Continue pain control with morphine and acetaminophen, will add ibuprofen.    -3/19 consulted ID Dr. Bennetta Laosobert Comar who will see patient.  Await recommendations   Hypokalemia -Resolved  3. Tobacco abuse. Smoking cessation counseling.   4. Hx of peptic ulcer disease (helicobacter pylori) will add pantoprazole for GI prophylaxis.   Marijuana abuse -3/19 urine tox screen pending  Goals of care -3/19  consult PT;Patient with serious dog bite to LEFT hand requiring surgery and IV antibiotics.  ID to evaluate most likely will need IV antibiotics evaluate for SNF -3/19 LCSW consult; patient with serious dog bite which will require long-term antibiotics and follow-up.  Largest concern is patient losing his house secondary to not being able to work.   DVT prophylaxis: SCD Code Status: Full Family Communication:  Disposition Plan: TBD awaiting recommendation from LCSW and advanced home care 1.  Where the patient is from home 2.  Anticipated d/c place. 3.  Barriers to d/c; IV antibiotics.  SNF?    Consultants:  ID Dr. Bennetta Laos Hand surgeon Dr.James J. Creighton, III    Procedures/Significant Events:  3/17 SURGICAL PROCEDURES: Dr.James J. Creighton, III 1.  Irrigation treatment of left dorsal hand and wrist abscess 2.  Left radical tenosynovectomy of the second, third and fourth dorsal compartments 3.  Left bone biopsy/saucerization of the dorsal trapezoid    I have personally reviewed and interpreted all radiology studies and my findings are as above.  VENTILATOR SETTINGS:    Cultures   Antimicrobials: Anti-infectives (From admission, onward)   Start     Dose/Rate Stop   05/10/19 0300  vancomycin (VANCOREADY) IVPB 750 mg/150 mL     750 mg 150 mL/hr over 60 Minutes     05/09/19 1330  ceFEPIme (MAXIPIME) 2 g in sodium chloride 0.9 % 100 mL IVPB     2 g 200 mL/hr over 30 Minutes     05/09/19 1315  vancomycin (VANCOREADY) IVPB 1500 mg/300 mL     1,500 mg 150 mL/hr over 120 Minutes 05/09/19 1814   05/09/19 0800  cefTRIAXone (ROCEPHIN) 1 g in sodium chloride 0.9 % 100 mL IVPB  Status:  Discontinued     1 g 200 mL/hr over 30 Minutes 05/09/19 0717   05/09/19 0800  Ampicillin-Sulbactam (UNASYN) 3 g in sodium chloride 0.9 % 100 mL IVPB     3 g 200 mL/hr over 30 Minutes 05/09/19 0943       Devices    LINES / TUBES:      Continuous Infusions: . ceFEPime  (MAXIPIME) IV 2 g (05/11/19 1227)  . vancomycin Stopped (05/11/19 0400)     Objective: Vitals:   05/10/19 0806 05/10/19 1536 05/10/19 2125 05/11/19 0411  BP: 114/85 120/90 123/80 (!) 126/92  Pulse: 66 69 75 60  Resp: 18 18 18 18   Temp: 97.8 F (36.6 C) 98.4 F (36.9 C) 98.2 F (36.8 C) 98.7 F (37.1 C)  TempSrc: Oral Oral Oral Oral  SpO2: 100% 100% 99% 98%  Weight:      Height:        Intake/Output Summary (Last 24 hours) at 05/11/2019 1230 Last data filed at 05/11/2019 1227 Gross per 24 hour  Intake 1450 ml  Output 1650 ml  Net -200 ml   Filed Weights   05/09/19 05/11/19 05/09/19 2026  Weight: 65.8 kg 65.8 kg    Examination:  General: A/O x4, no acute respiratory distress Eyes: negative scleral hemorrhage, negative anisocoria, negative icterus ENT: Negative Runny nose, negative gingival bleeding, Neck:  Negative scars, masses, torticollis, lymphadenopathy, JVD Lungs: Clear to auscultation bilaterally without wheezes or crackles Cardiovascular: Regular rate and rhythm without  murmur gallop or rub normal S1 and S2 Abdomen: negative abdominal pain, nondistended, positive soft, bowel sounds, no rebound, no ascites, no appreciable mass Extremities: LEFT hand and forearm swollen warm to the touch.  Did not take down dressing Skin: Negative rashes, lesions, ulcers Psychiatric:  Negative depression, positive anxiety, negative fatigue, negative mania  Central nervous system:  Cranial nerves II through XII intact, tongue/uvula midline, all extremities muscle strength 5/5, except for left hand, sensation intact throughout, negative dysarthria, negative expressive aphasia, negative receptive aphasia.  .     Data Reviewed: Care during the described time interval was provided by me .  I have reviewed this patient's available data, including medical history, events of note, physical examination, and all test results as part of my evaluation.  CBC: Recent Labs  Lab 05/08/19 2337  05/10/19 0219  WBC 9.3 11.6*  NEUTROABS 5.6  --   HGB 13.4 12.3*  HCT 37.3* 34.8*  MCV 79.5* 79.5*  PLT 393 374   Basic Metabolic Panel: Recent Labs  Lab 05/08/19 2337 05/09/19 1444 05/10/19 0219  NA 142  --  139  K 3.1*  --  4.3  CL 108  --  105  CO2 24  --  23  GLUCOSE 103*  --  134*  BUN 12  --  9  CREATININE 1.05  --  1.01  CALCIUM 8.6*  --  8.4*  MG  --  2.0  --    GFR: Estimated Creatinine Clearance: 86 mL/min (by C-G formula based on SCr of 1.01 mg/dL). Liver Function Tests: Recent Labs  Lab 05/08/19 2337 05/10/19 0219  AST 17 12*  ALT 16 15  ALKPHOS 54 47  BILITOT 0.4 0.5  PROT 6.7 5.8*  ALBUMIN 3.4* 2.9*   No results for input(s): LIPASE, AMYLASE in the last 168 hours. No results for input(s): AMMONIA in the last 168 hours. Coagulation Profile: No results for input(s): INR, PROTIME in the last 168 hours. Cardiac Enzymes: No results for input(s): CKTOTAL, CKMB, CKMBINDEX, TROPONINI in the last 168 hours. BNP (last 3 results) No results for input(s): PROBNP in the last 8760 hours. HbA1C: No results for input(s): HGBA1C in the last 72 hours. CBG: No results for input(s): GLUCAP in the last 168 hours. Lipid Profile: No results for input(s): CHOL, HDL, LDLCALC, TRIG, CHOLHDL, LDLDIRECT in the last 72 hours. Thyroid Function Tests: No results for input(s): TSH, T4TOTAL, FREET4, T3FREE, THYROIDAB in the last 72 hours. Anemia Panel: No results for input(s): VITAMINB12, FOLATE, FERRITIN, TIBC, IRON, RETICCTPCT in the last 72 hours. Sepsis Labs: Recent Labs  Lab 05/08/19 2359 05/09/19 0640  LATICACIDVEN 0.9 0.9    Recent Results (from the past 240 hour(s))  Respiratory Panel by RT PCR (Flu A&B, Covid) - Nasopharyngeal Swab     Status: None   Collection Time: 05/09/19 12:49 PM   Specimen: Nasopharyngeal Swab  Result Value Ref Range Status   SARS Coronavirus 2 by RT PCR NEGATIVE NEGATIVE Final    Comment: (NOTE) SARS-CoV-2 target nucleic acids are  NOT DETECTED. The SARS-CoV-2 RNA is generally detectable in upper respiratoy specimens during the acute phase of infection. The lowest concentration of SARS-CoV-2 viral copies this assay can detect is 131 copies/mL. A negative result does not preclude SARS-Cov-2 infection and should not be used as the sole basis for treatment or other patient management decisions. A negative result may occur with  improper specimen collection/handling, submission of specimen other than nasopharyngeal swab, presence of viral mutation(s) within the  areas targeted by this assay, and inadequate number of viral copies (<131 copies/mL). A negative result must be combined with clinical observations, patient history, and epidemiological information. The expected result is Negative. Fact Sheet for Patients:  PinkCheek.be Fact Sheet for Healthcare Providers:  GravelBags.it This test is not yet ap proved or cleared by the [place] FDA and  has been authorized for detection and/or diagnosis of SARS-CoV-2 by FDA under an Emergency Use Authorization (EUA). This EUA will remain  in effect (meaning this test can be used) for the duration of the COVID-19 declaration under Section 564(b)(1) of the Act, 21 U.S.C. section 360bbb-3(b)(1), unless the authorization is terminated or revoked sooner.    Influenza A by PCR NEGATIVE NEGATIVE Final   Influenza B by PCR NEGATIVE NEGATIVE Final    Comment: (NOTE) The Xpert Xpress SARS-CoV-2/FLU/RSV assay is intended as an aid in  the diagnosis of influenza from Nasopharyngeal swab specimens and  should not be used as a sole basis for treatment. Nasal washings and  aspirates are unacceptable for Xpert Xpress SARS-CoV-2/FLU/RSV  testing. Fact Sheet for Patients: PinkCheek.be Fact Sheet for Healthcare Providers: GravelBags.it This test is not yet approved or  cleared by the [place] FDA and  has been authorized for detection and/or diagnosis of SARS-CoV-2 by  FDA under an Emergency Use Authorization (EUA). This EUA will remain  in effect (meaning this test can be used) for the duration of the  Covid-19 declaration under Section 564(b)(1) of the Act, 21  U.S.C. section 360bbb-3(b)(1), unless the authorization is  terminated or revoked. Performed at [facility], [place] [address] [place], [place] [address]   Culture, blood (Routine X 2) w Reflex to ID Panel     Status: None (Preliminary result)   Collection Time: 05/09/19  2:33 PM   Specimen: BLOOD  Result Value Ref Range Status   Specimen Description BLOOD LEFT ANTECUBITAL  Final   Special Requests   Final    BOTTLES DRAWN AEROBIC AND ANAEROBIC Blood Culture results may not be optimal due to an excessive volume of blood received in culture bottles   Culture   Final    NO GROWTH 2 DAYS Performed at [facility], [place] [address] [place], [place] [address]    Report Status PENDING  Incomplete  Culture, blood (Routine X 2) w Reflex to ID Panel     Status: None (Preliminary result)   Collection Time: 05/09/19  2:44 PM   Specimen: BLOOD  Result Value Ref Range Status   Specimen Description BLOOD RIGHT ANTECUBITAL  Final   Special Requests   Final    BOTTLES DRAWN AEROBIC AND ANAEROBIC Blood Culture results may not be optimal due to an inadequate volume of blood received in culture bottles   Culture   Final    NO GROWTH 2 DAYS Performed at [facility], [place] [address], [place], [place] [address]    Report Status PENDING  Incomplete  Surgical PCR screen     Status: None   Collection Time: 05/09/19  3:50 PM   Specimen: Nasal Mucosa; Nasal Swab  Result Value Ref Range Status   MRSA, PCR NEGATIVE NEGATIVE Final   Staphylococcus aureus NEGATIVE NEGATIVE Final    Comment: (NOTE) The Xpert SA Assay (FDA approved for NASAL specimens in patients 74 years of age and older),  is one component of a comprehensive surveillance program. It is not intended to diagnose infection nor to guide or monitor treatment. Performed  at [facility], [address] [place], [place] [address]   Aerobic/Anaerobic Culture (surgical/deep wound)     Status: None (Preliminary result)   Collection Time: 05/09/19  5:21 PM   Specimen: Wound  Result Value Ref Range Status   Specimen Description HAND  Final   Special Requests LEFT SAMPLE A  Final   Gram Stain   Final    RARE WBC PRESENT, PREDOMINANTLY PMN NO ORGANISMS SEEN    Culture   Final    NO GROWTH 2 DAYS Performed at [facility], [address], [place], [place] [address]    Report Status PENDING  Incomplete  Aerobic/Anaerobic Culture (surgical/deep wound)     Status: None (Preliminary result)   Collection Time: 05/09/19  5:45 PM   Specimen: Wound  Result Value Ref Range Status   Specimen Description WOUND  Final   Special Requests LEFT TRAPEZOID SAMPLE B  Final   Gram Stain   Final    RARE WBC PRESENT, PREDOMINANTLY PMN NO ORGANISMS SEEN Performed at [facility], [address] [place], [place] [address]    Culture   Final    RARE STAPHYLOCOCCUS AUREUS NO ANAEROBES ISOLATED; CULTURE IN PROGRESS FOR 5 DAYS    Report Status PENDING  Incomplete         Radiology Studies: No results found.      Scheduled Meds: . pantoprazole  40 mg Oral Daily   Continuous Infusions: . ceFEPime (MAXIPIME) IV 2 g (05/11/19 1227)  . vancomycin Stopped (05/11/19 0400)     LOS: 2 days    Time spent:40 min    Mackenize Delgadillo, Roselind Messier, MD [facility] Pager [phone]  If 7PM-7AM, please contact night-coverage [email or web address] Password [facility] 05/11/2019, 12:30 PM

## 2019-05-11 NOTE — TOC Progression Note (Signed)
Transition of Care [facility]) - Progression Note    Patient Details  Name: David Galloway MRN: [id number] Date of Birth: 1975/11/13  Transition of Care [facility] CM/SW Contact  Sophiya Morello, [name], RN Phone Number: 05/11/2019, 3:55 PM  Clinical Narrative:     Possible discharge home Saturday or Sunday on IV Ancef or Vancomycin.   Pam with [facility] has called patient and will come by this evening for teaching.   Referral for home health RN given to Clydie Braun with [facility]. KAren will call patient.   Expected Discharge Plan: Home w Home Health Services Barriers to Discharge: Continued Medical Work up  Expected Discharge Plan and Services Expected Discharge Plan: Home w Home Health Services In-house Referral: Artist Discharge Planning Services: CM Consult Post Acute Care Choice: Home Health Living arrangements for the past 2 months: Single Family Home                 DME Arranged: N/A         HH Arranged: NA           Social Determinants of Health (SDOH) Interventions    Readmission Risk Interventions No flowsheet data found.

## 2019-05-11 NOTE — Progress Notes (Signed)
Dressing changed on L hand. Education provided on how to complete dressing change and importance of keeping it clean and dry.

## 2019-05-11 NOTE — Plan of Care (Signed)
  Problem: Coping: Goal: Level of anxiety will decrease Outcome: Progressing Note: Pt worried about dogs being home by themselves for 3 days; however, has found someone to check on them   Problem: Elimination: Goal: Will not experience complications related to bowel motility Outcome: Progressing Goal: Will not experience complications related to urinary retention Outcome: Progressing   Problem: Pain Managment: Goal: General experience of comfort will improve Outcome: Progressing Note: No complaints of pain   Problem: Safety: Goal: Ability to remain free from injury will improve Outcome: Progressing   Problem: Skin Integrity: Goal: Risk for impaired skin integrity will decrease Outcome: Progressing Note: Wound care completed. IV antibiotics.

## 2019-05-11 NOTE — Consult Note (Signed)
[facility]       Reason for Consult: osteomyelitis    Referring Physician: Dr. Sherral Hammers  Principal Problem:   Cellulitis of left hand Active Problems:   Sickle cell trait ([place])   Hypokalemia   Tobacco abuse   Marijuana abuse   Duodenal ulcer due to Helicobacter pylori   . pantoprazole  40 mg Oral Daily    Recommendations: picc line (ordered) Home health - vancomycin or cefazolin (depending on Staph sensitivies) through April 27th opat once final antibiotic determined Labs per protocol  I will arrange follow up with me  Assessment: He has an extensive infection including tenosynovitis to 2nd, 3rd and 4th compartments and osteomyelitis of the capatae/trapazoid s/p debridement by Dr. Jeannie Fend.  With the extensive infection including in the bone, will use IV therapy. Culture growing Staph aureus.   Antibiotics: Vancomycin and cefepime  HPI: David Galloway is a 44 y.o. male with progressively worsening left hand swelling and found to have infection as above.  He had been attacked by a dog previously and was given augmentin but swelling worsened more for 2 days prior to presentation.  No fever.  He underwent debridement by Dr. Jeannie Fend on 3/17.  Left hand now wrapped.  + tobacco.   No associated n/v/d.    Review of Systems:  Constitutional: negative for fevers and chills Gastrointestinal: negative for diarrhea All other systems reviewed and are negative    Past Medical History:  Diagnosis Date  . Duodenal ulcer due to Helicobacter pylori   . Sickle cell trait (HCC)     Social History   Tobacco Use  . Smoking status: Former Smoker    Quit date: 03/28/2011    Years since quitting: 8.1  . Smokeless tobacco: Never Used  Substance Use Topics  . Alcohol use: No  . Drug use: No    Family History  Problem Relation Age of Onset  . Diabetes Father     No Known Allergies  Physical Exam: Constitutional: in no apparent distress  Vitals:   05/11/19 0411 05/11/19 1531  BP: (!) 126/92 112/84  Pulse: 60 68  Resp: 18 18  Temp: 98.7 F (37.1 C) 98.5 F (36.9 C)  SpO2: 98% 99%   EYES: anicteric Cardiovascular: Cor RRR Respiratory: clear; GI: soft Musculoskeletal: left hand wrapped Skin: negatives: no rash   Lab Results  Component Value Date   WBC 11.6 (H) 05/10/2019   HGB 12.3 (L) 05/10/2019   HCT 34.8 (L) 05/10/2019   MCV 79.5 (L) 05/10/2019   PLT 374 05/10/2019    Lab Results  Component Value Date   CREATININE 1.01 05/10/2019   BUN 9 05/10/2019   NA 139 05/10/2019   K 4.3 05/10/2019   CL 105 05/10/2019   CO2 23 05/10/2019    Lab Results  Component Value Date   ALT 15 05/10/2019   AST 12 (L) 05/10/2019   ALKPHOS 47 05/10/2019     Microbiology: Recent Results (from the past 240 hour(s))  Respiratory Panel by RT PCR (Flu A&B, Covid) - Nasopharyngeal Swab     Status: None   Collection Time: 05/09/19 12:49 PM   Specimen: Nasopharyngeal Swab  Result Value Ref Range Status   SARS Coronavirus 2 by RT PCR NEGATIVE NEGATIVE Final    Comment: (NOTE) SARS-CoV-2 target nucleic acids are NOT DETECTED. The SARS-CoV-2 RNA is generally detectable in upper respiratoy specimens during the acute phase of infection. The lowest concentration of SARS-CoV-2 viral copies  this assay can detect is 131 copies/mL. A negative result does not preclude SARS-Cov-2 infection and should not be used as the sole basis for treatment or other patient management decisions. A negative result may occur with  improper specimen collection/handling, submission of specimen other than nasopharyngeal swab, presence of viral mutation(s) within the areas targeted by this assay, and inadequate number of viral copies (<131 copies/mL). A negative result must be combined with clinical observations, patient history, and epidemiological information. The expected result is Negative. Fact Sheet for Patients:   https://www.[employer].com/ Fact Sheet for Healthcare Providers:  https://www.young.biz/ This test is not yet ap proved or cleared by the [place] FDA and  has been authorized for detection and/or diagnosis of SARS-CoV-2 by FDA under an Emergency Use Authorization (EUA). This EUA will remain  in effect (meaning this test can be used) for the duration of the COVID-19 declaration under Section 564(b)(1) of the Act, 21 U.S.C. section 360bbb-3(b)(1), unless the authorization is terminated or revoked sooner.    Influenza A by PCR NEGATIVE NEGATIVE Final   Influenza B by PCR NEGATIVE NEGATIVE Final    Comment: (NOTE) The Xpert Xpress SARS-CoV-2/FLU/RSV assay is intended as an aid in  the diagnosis of influenza from Nasopharyngeal swab specimens and  should not be used as a sole basis for treatment. Nasal washings and  aspirates are unacceptable for Xpert Xpress SARS-CoV-2/FLU/RSV  testing. Fact Sheet for Patients: https://www.[employer].com/ Fact Sheet for Healthcare Providers: https://www.young.biz/ This test is not yet approved or cleared by the [place] FDA and  has been authorized for detection and/or diagnosis of SARS-CoV-2 by  FDA under an Emergency Use Authorization (EUA). This EUA will remain  in effect (meaning this test can be used) for the duration of the  Covid-19 declaration under Section 564(b)(1) of the Act, 21  U.S.C. section 360bbb-3(b)(1), unless the authorization is  terminated or revoked. Performed at [facility], [address], [place], [place] [address]   Culture, blood (Routine X 2) w Reflex to ID Panel     Status: None (Preliminary result)   Collection Time: 05/09/19  2:33 PM   Specimen: BLOOD  Result Value Ref Range Status   Specimen Description BLOOD LEFT ANTECUBITAL  Final   Special Requests   Final    BOTTLES DRAWN AEROBIC AND ANAEROBIC Blood Culture results may not be  optimal due to an excessive volume of blood received in culture bottles   Culture   Final    NO GROWTH 2 DAYS Performed at [facility], [address] [place], [place] [address]    Report Status PENDING  Incomplete  Culture, blood (Routine X 2) w Reflex to ID Panel     Status: None (Preliminary result)   Collection Time: 05/09/19  2:44 PM   Specimen: BLOOD  Result Value Ref Range Status   Specimen Description BLOOD RIGHT ANTECUBITAL  Final   Special Requests   Final    BOTTLES DRAWN AEROBIC AND ANAEROBIC Blood Culture results may not be optimal due to an inadequate volume of blood received in culture bottles   Culture   Final    NO GROWTH 2 DAYS Performed at [facility], [address] [place], [place] [address]    Report Status PENDING  Incomplete  Surgical PCR screen     Status: None   Collection Time: 05/09/19  3:50 PM   Specimen: Nasal Mucosa; Nasal Swab  Result Value Ref Range Status   MRSA, PCR  NEGATIVE NEGATIVE Final   Staphylococcus aureus NEGATIVE NEGATIVE Final    Comment: (NOTE) The Xpert SA Assay (FDA approved for NASAL specimens in patients 4 years of age and older), is one component of a comprehensive surveillance program. It is not intended to diagnose infection nor to guide or monitor treatment. Performed at [facility], [address] [place], [place] [address]   Aerobic/Anaerobic Culture (surgical/deep wound)     Status: None (Preliminary result)   Collection Time: 05/09/19  5:21 PM   Specimen: Wound  Result Value Ref Range Status   Specimen Description HAND  Final   Special Requests LEFT SAMPLE A  Final   Gram Stain   Final    RARE WBC PRESENT, PREDOMINANTLY PMN NO ORGANISMS SEEN    Culture   Final    NO GROWTH 2 DAYS Performed at [facility], [address], [place], [place] [address]    Report Status PENDING  Incomplete  Aerobic/Anaerobic Culture (surgical/deep wound)     Status: None (Preliminary result)    Collection Time: 05/09/19  5:45 PM   Specimen: Wound  Result Value Ref Range Status   Specimen Description WOUND  Final   Special Requests LEFT TRAPEZOID SAMPLE B  Final   Gram Stain   Final    RARE WBC PRESENT, PREDOMINANTLY PMN NO ORGANISMS SEEN Performed at [facility], [address], [place], [place] [address]    Culture   Final    RARE STAPHYLOCOCCUS AUREUS NO ANAEROBES ISOLATED; CULTURE IN PROGRESS FOR 5 DAYS    Report Status PENDING  Incomplete    Gardiner Barefoot, MD [facility] [email or web address] 05/11/2019, 4:05 PM

## 2019-05-11 NOTE — Progress Notes (Addendum)
Occupational Therapy Treatment Patient Details Name: David Galloway MRN: [id number] DOB: October 11, 1975 Today's Date: 05/11/2019    History of present illness Pt is a 44 y/o male with PMH of tobacco abuse, marijuana abuse, sickle cell trait, duodenal ulcer due to  David Galloway presents to emergency department due to left hand swelling for 10 days (reporting being attacked by a dog). S/P I&D L dorsal hand and wrist abscess, L radical tenosynovectomy of 2nd, 3rd and 4th dorsal compartments, L bone biopsy of dorsal trapezoid. Per ortho early digit and wrist ROM as tolerated.    OT comments  [name] and agreeable to OT.  Educated on exercises for L hand AROM/AAROM/stretch and exercise progression to improve functional use and strength.  Pt remains limited by hand edema and weakness--requires AAROM to complete full extension of digits, educated on importance of initiation of movement with L and to assist full extension with R UE.  Educated on movement to tolerance, L hand functional use and importance of elevation to UE for edema reduction. Provided HEP and [place] exercises to progress as tolerated, as patient reports unsure if he would make it to OP OT due to need to go back to work.  Highly recommend OP OT at dc to progress L hand functional use. Will follow acutely.    Follow Up Recommendations  Follow surgeon's recommendation for DC plan and follow-up therapies;Other (comment)(OP OT as cleared by surgeon )    Equipment Recommendations  None recommended by OT    Recommendations for Other Services      Precautions / Restrictions Precautions Precautions: None Restrictions Weight Bearing Restrictions: No       Mobility Bed Mobility                  Transfers                      Balance                                           ADL either performed or assessed with clinical judgement   ADL                                          General ADL Comments: session focused on L UE exercises      Vision       Perception     Praxis      Cognition Arousal/Alertness: Awake/alert Behavior During Therapy: WFL for tasks assessed/performed Overall Cognitive Status: Within Functional Limits for tasks assessed                                          Exercises Exercises: Hand exercises Hand Exercises Forearm Supination: AROM;Left;10 reps;Supine Forearm Pronation: AROM;Left;10 reps;Supine Wrist Flexion: AROM;AAROM;5 reps;Left;Supine(very limited due to pain/ace wrap ) Wrist Extension: AROM;AAROM;Left;5 reps;Supine(very limited due to pain/ace wrap ) Digit Composite Flexion: AROM;Left;10 reps;Supine Composite Extension: 10 reps;Left;Supine;AAROM(initating extension, full extension AAROM with R UE ) Digit Composite Abduction: AROM;Left;10 reps;Supine Digit Composite Adduction: AROM;Right;10 reps;Supine Thumb Abduction: AROM;Left;10 reps;Supine(flexion/extension as well ) Thumb Adduction: AROM;Left;10 reps;Supine Opposition: AROM;Seated;Left;10 reps(all digits today )   Shoulder Instructions  General Comments educated on HEP, provided [facility] handout for progression of exercises (as reports unsure he can make it to OP OT); continued educated on keeping L hand dry and clean (keeping post surgical wrap on until MD clears), progression of exercises and ADL functional use as tolerated     Pertinent Vitals/ Pain       Pain Assessment: Faces Faces Pain Scale: Hurts a little bit Pain Location: L hand with movement  Pain Descriptors / Indicators: Discomfort;Sore;Operative site guarding Pain Intervention(s): Monitored during session;Repositioned  Home Living                                          Prior Functioning/Environment              Frequency  Min 2X/week        Progress Toward Goals  OT Goals(current goals can now be found in the care plan section)   Progress towards OT goals: Progressing toward goals  Acute Rehab OT Goals Patient Stated Goal: to get my L hand feeling better OT Goal Formulation: With patient  Plan Discharge plan remains appropriate;Frequency remains appropriate    Co-evaluation                 AM-PAC OT "6 Clicks" Daily Activity     Outcome Measure   Help from another person eating meals?: None Help from another person taking care of personal grooming?: None Help from another person toileting, which includes using toliet, bedpan, or urinal?: None Help from another person bathing (including washing, rinsing, drying)?: None Help from another person to put on and taking off regular upper body clothing?: None Help from another person to put on and taking off regular lower body clothing?: None 6 Click Score: 24    End of Session    OT Visit Diagnosis: Pain;Muscle weakness (generalized) (M62.81) Pain - Right/Left: Left Pain - part of body: Arm;Hand   Activity Tolerance Patient tolerated treatment well   Patient Left in bed;with call bell/phone within reach   Nurse Communication          Time: 4403-4742 OT Time Calculation (min): 25 min  Charges: OT General Charges $OT Visit: 1 Visit OT Treatments $Therapeutic Exercise: 23-37 mins  Barry Brunner, OT Acute Rehabilitation Services Pager [phone] Office [phone]     Chancy Milroy 05/11/2019, 8:41 AM

## 2019-05-12 LAB — MAGNESIUM: Magnesium: 2.1 mg/dL (ref 1.7–2.4)

## 2019-05-12 LAB — CBC WITH DIFFERENTIAL/PLATELET
Abs Immature Granulocytes: 0.07 10*3/uL (ref 0.00–0.07)
Basophils Absolute: 0.1 10*3/uL (ref 0.0–0.1)
Basophils Relative: 1 %
Eosinophils Absolute: 0.4 10*3/uL (ref 0.0–0.5)
Eosinophils Relative: 4 %
HCT: 37.3 % — ABNORMAL LOW (ref 39.0–52.0)
Hemoglobin: 13.1 g/dL (ref 13.0–17.0)
Immature Granulocytes: 1 %
Lymphocytes Relative: 28 %
Lymphs Abs: 2.6 10*3/uL (ref 0.7–4.0)
MCH: 27.7 pg (ref 26.0–34.0)
MCHC: 35.1 g/dL (ref 30.0–36.0)
MCV: 78.9 fL — ABNORMAL LOW (ref 80.0–100.0)
Monocytes Absolute: 1 10*3/uL (ref 0.1–1.0)
Monocytes Relative: 11 %
Neutro Abs: 5.2 10*3/uL (ref 1.7–7.7)
Neutrophils Relative %: 55 %
Platelets: 373 10*3/uL (ref 150–400)
RBC: 4.73 MIL/uL (ref 4.22–5.81)
RDW: 13.5 % (ref 11.5–15.5)
WBC: 9.4 10*3/uL (ref 4.0–10.5)
nRBC: 0 % (ref 0.0–0.2)

## 2019-05-12 LAB — COMPREHENSIVE METABOLIC PANEL
ALT: 13 U/L (ref 0–44)
AST: 13 U/L — ABNORMAL LOW (ref 15–41)
Albumin: 3.1 g/dL — ABNORMAL LOW (ref 3.5–5.0)
Alkaline Phosphatase: 53 U/L (ref 38–126)
Anion gap: 11 (ref 5–15)
BUN: 11 mg/dL (ref 6–20)
CO2: 27 mmol/L (ref 22–32)
Calcium: 8.8 mg/dL — ABNORMAL LOW (ref 8.9–10.3)
Chloride: 103 mmol/L (ref 98–111)
Creatinine, Ser: 1.03 mg/dL (ref 0.61–1.24)
GFR calc Af Amer: >60 mL/min (ref >60)
GFR calc non Af Amer: >60 mL/min (ref >60)
Glucose, Bld: 113 mg/dL — ABNORMAL HIGH (ref 70–99)
Potassium: 3.6 mmol/L (ref 3.5–5.1)
Sodium: 141 mmol/L (ref 135–145)
Total Bilirubin: 0.5 mg/dL (ref 0.3–1.2)
Total Protein: 5.9 g/dL — ABNORMAL LOW (ref 6.5–8.1)

## 2019-05-12 LAB — PHOSPHORUS: Phosphorus: 4.9 mg/dL — ABNORMAL HIGH (ref 2.5–4.6)

## 2019-05-12 LAB — VANCOMYCIN, PEAK: Vancomycin Pk: 33 ug/mL (ref 30–40)

## 2019-05-12 MED ORDER — CHLORHEXIDINE GLUCONATE CLOTH 2 % EX PADS
6.0000 | MEDICATED_PAD | Freq: Every day | CUTANEOUS | Status: DC
  Administered 2019-05-12 – 2019-05-15 (×3): 6 via TOPICAL

## 2019-05-12 MED ORDER — SODIUM CHLORIDE 0.9% FLUSH: 10.0000 mL | INTRAVENOUS | Status: DC | PRN

## 2019-05-12 NOTE — Progress Notes (Signed)
Peripherally Inserted Central Catheter Placement  The IV Nurse has discussed with the patient and/or persons authorized to consent for the patient, the purpose of this procedure and the potential benefits and risks involved with this procedure.  The benefits include less needle sticks, lab draws from the catheter, and the patient may be discharged home with the catheter. Risks include, but not limited to, infection, bleeding, blood clot (thrombus formation), and puncture of an artery; nerve damage and irregular heartbeat and possibility to perform a PICC exchange if needed/ordered by physician.  Alternatives to this procedure were also discussed.  Bard Power PICC patient education guide, fact sheet on infection prevention and patient information card has been provided to patient /or left at bedside.    PICC Placement Documentation  PICC Single Lumen 05/12/19 PICC Right Brachial 40 cm 0 cm (Active)  Indication for Insertion or Continuance of Line Home intravenous therapies (PICC only) 05/12/19 1100  Exposed Catheter (cm) 0 cm 05/12/19 1100  Site Assessment Clean;Dry;Intact 05/12/19 1100  Line Status Flushed;Blood return noted;Saline locked 05/12/19 1100  Dressing Type Transparent 05/12/19 1100  Dressing Status Clean;Dry;Intact;Antimicrobial disc in place;Other (Comment) 05/12/19 1100  Line Care Connections checked and tightened 05/12/19 1100  Dressing Intervention New dressing 05/12/19 1100  Dressing Change Due 05/19/19 05/12/19 1100       David Galloway, David Galloway 05/12/2019, 11:54 AM

## 2019-05-12 NOTE — Plan of Care (Signed)

## 2019-05-12 NOTE — Progress Notes (Signed)
PROGRESS NOTE    David Galloway  GPQ:[id number] DOB: 1975/07/10 DOA: 05/08/2019 PCP: Patient, No Pcp Per     Brief Narrative:  44 year old male PMHx Tobacco abuse, Marijuana abuse, Sickle cell trait, Hx Duodenal ulcers.   Presented with left hand edema.   He presented with 10 days of left hand edema, apparently he suffered dog bites in his left hand, and face.  At that time his lacerations were repaired and he received Augmentin for antibiotic therapy.  For the last 48 hours his left hand has become more edematous and tender.  On his initial physical examination his blood pressure was 135/85, heart rate 81, respiratory rate 16, temperature 98.6, oxygen saturation 99%.  His lungs are clear to auscultation bilaterally, heart S1-S2 present rhythmic, his abdomen was soft, no lower extremity edema, his left hand was edematous, tender to palpation.  Sodium 142, potassium 3.1, chloride 108, bicarb 24, glucose 103, BUN 12, creatinine 1.0, white count 9.3, hemoglobin 13.4, hematocrit 37.3, platelets 393.  SARS COVID-19 negative.    Left hand x-ray with moderate soft tissue swelling of the dorsum of the left hand, progressed from prior imaging.  No radiographic evidence of osteomyelitis.  Left hand MRI with subcutaneous abscess over the metacarpals.  Septic tenosynovitis of the compartment 2, 3 and 4 extensors, worse in compartment 4.  Marrow edema and enhancement of the dorsal aspects of the capitate and trapezoid bones most consistent with osteomyelitis.   Subjective: 3/20 A/O x4, negative CP, negative S OB.  Left hand pain but decreasing.    Assessment & Plan:   Principal Problem:   Cellulitis of left hand Active Problems:   Sickle cell trait (HCC)   Hypokalemia   Tobacco abuse   Marijuana abuse   Duodenal ulcer due to Helicobacter pylori  Left hand cellulitis, tenosynovitis, and capitate/episode bones osteomyelitis -3/17 sp surgical irrigation, tenosynovectomy and bone biopsy.  -3/20  decreasing inflammation left hand and forearm able to open and close hand.  -Continue leukocytosis however patient understands he will be on long-term IV antibiotics -3/20 per ID Dr. Molly Maduro Comar patient will need to continue IV vancomycin and cefepime End date April 27  -PT recommends no follow-up -LCSW has not seen patient yet  Hypokalemia -Resolved  Tobacco abuse -Patient counseled on smoking cessation    Hx PUD secondary to H. pylori. -Protonix 40 mg daily  Marijuana abuse -3/19 urine tox screen positive marijuana -We will counsel patient on need to discontinue  Goals of care -3/19 consult PT; recommends no follow-up -3/19 LCSW consult; patient with serious dog bite which will require long-term antibiotics and follow-up.  Largest concern is patient losing his house secondary to not being able to work. -3/20 LCSW consult; second request for social work to see patient   DVT prophylaxis: SCD Code Status: Full Family Communication:  Disposition Plan: TBD awaiting recommendation from LCSW and advanced home care 1.  Where the patient is from home 2.  Anticipated d/c place. 3.  Barriers to d/c; IV antibiotics.  LCSW has not seen patient as requested on 3/19    Consultants:  ID Dr. Bennetta Laos Hand surgeon Dr.James J. Creighton, III    Procedures/Significant Events:  3/17 SURGICAL PROCEDURES: Dr.James J. Creighton, III 1.  Irrigation treatment of left dorsal hand and wrist abscess 2.  Left radical tenosynovectomy of the second, third and fourth dorsal compartments 3.  Left bone biopsy/saucerization of the dorsal trapezoid    I have personally reviewed and interpreted all radiology studies  and my findings are as above.  VENTILATOR SETTINGS:    Cultures 3/17 blood LEFT AC NGTD 3/17 blood RIGHT AC NGTD 3/17 LEFT hand NGTD 3/17 LEFT trapezoid sample positive rare staph aureus     Antimicrobials: Anti-infectives (From admission, onward)   Start      Dose/Rate Stop   05/10/19 0300  vancomycin (VANCOREADY) IVPB 750 mg/150 mL     750 mg 150 mL/hr over 60 Minutes     05/09/19 1330  ceFEPIme (MAXIPIME) 2 g in sodium chloride 0.9 % 100 mL IVPB     2 g 200 mL/hr over 30 Minutes     05/09/19 1315  vancomycin (VANCOREADY) IVPB 1500 mg/300 mL     1,500 mg 150 mL/hr over 120 Minutes 05/09/19 1814   05/09/19 0800  cefTRIAXone (ROCEPHIN) 1 g in sodium chloride 0.9 % 100 mL IVPB  Status:  Discontinued     1 g 200 mL/hr over 30 Minutes 05/09/19 0717   05/09/19 0800  Ampicillin-Sulbactam (UNASYN) 3 g in sodium chloride 0.9 % 100 mL IVPB     3 g 200 mL/hr over 30 Minutes 05/09/19 0943       Devices    LINES / TUBES:  PICC RIGHT Brachial 3/20>>>    Continuous Infusions: . ceFEPime (MAXIPIME) IV 2 g (05/12/19 0514)  . vancomycin 150 mL/hr at 05/12/19 0300     Objective: Vitals:   05/10/19 2125 05/11/19 0411 05/11/19 1531 05/11/19 2226  BP: 123/80 (!) 126/92 112/84 (!) 120/95  Pulse: 75 60 68 82  Resp: 18 18 18 16   Temp: 98.2 F (36.8 C) 98.7 F (37.1 C) 98.5 F (36.9 C) 98.6 F (37 C)  TempSrc: Oral Oral Oral   SpO2: 99% 98% 99% 100%  Weight:      Height:        Intake/Output Summary (Last 24 hours) at 05/12/2019 0902 Last data filed at 05/12/2019 0300 Gross per 24 hour  Intake 1826.29 ml  Output 950 ml  Net 876.29 ml   Filed Weights   05/09/19 [id number] 05/09/19 2026  Weight: 65.8 kg 65.8 kg   Physical Exam:  General: A/O x4, no acute respiratory distress Eyes: negative scleral hemorrhage, negative anisocoria, negative icterus ENT: Negative Runny nose, negative gingival bleeding, Neck:  Negative scars, masses, torticollis, lymphadenopathy, JVD Lungs: Clear to auscultation bilaterally without wheezes or crackles Cardiovascular: Regular rate and rhythm without murmur gallop or rub normal S1 and S2 Abdomen: negative abdominal pain, nondistended, positive soft, bowel sounds, no rebound, no ascites, no appreciable  mass Extremities: LEFT hand and forearm swollen and warm to touch but improved from 3/19.  Patient now able to make a fist with left hand. Skin: Negative rashes, lesions, ulcers Psychiatric:  Negative depression, negative anxiety, negative fatigue, negative mania  Central nervous system:  Cranial nerves II through XII intact, tongue/uvula midline, all extremities muscle strength 5/5, sensation intact throughout, negative dysarthria, negative expressive aphasia, negative receptive aphasia.  .     Data Reviewed: Care during the described time interval was provided by me .  I have reviewed this patient's available data, including medical history, events of note, physical examination, and all test results as part of my evaluation.  CBC: Recent Labs  Lab 05/08/19 2337 05/10/19 0219 05/12/19 0242  WBC 9.3 11.6* 9.4  NEUTROABS 5.6  --  5.2  HGB 13.4 12.3* 13.1  HCT 37.3* 34.8* 37.3*  MCV 79.5* 79.5* 78.9*  PLT 393 374 373   Basic Metabolic Panel: Recent  Labs  Lab 05/08/19 2337 05/09/19 1444 05/10/19 0219 05/12/19 0242  NA 142  --  139 141  K 3.1*  --  4.3 3.6  CL 108  --  105 103  CO2 24  --  23 27  GLUCOSE 103*  --  134* 113*  BUN 12  --  9 11  CREATININE 1.05  --  1.01 1.03  CALCIUM 8.6*  --  8.4* 8.8*  MG  --  2.0  --  2.1  PHOS  --   --   --  4.9*   GFR: Estimated Creatinine Clearance: 84.3 mL/min (by C-G formula based on SCr of 1.03 mg/dL). Liver Function Tests: Recent Labs  Lab 05/08/19 2337 05/10/19 0219 05/12/19 0242  AST 17 12* 13*  ALT 16 15 13   ALKPHOS 54 47 53  BILITOT 0.4 0.5 0.5  PROT 6.7 5.8* 5.9*  ALBUMIN 3.4* 2.9* 3.1*   No results for input(s): LIPASE, AMYLASE in the last 168 hours. No results for input(s): AMMONIA in the last 168 hours. Coagulation Profile: No results for input(s): INR, PROTIME in the last 168 hours. Cardiac Enzymes: No results for input(s): CKTOTAL, CKMB, CKMBINDEX, TROPONINI in the last 168 hours. BNP (last 3 results) No  results for input(s): PROBNP in the last 8760 hours. HbA1C: No results for input(s): HGBA1C in the last 72 hours. CBG: No results for input(s): GLUCAP in the last 168 hours. Lipid Profile: No results for input(s): CHOL, HDL, LDLCALC, TRIG, CHOLHDL, LDLDIRECT in the last 72 hours. Thyroid Function Tests: No results for input(s): TSH, T4TOTAL, FREET4, T3FREE, THYROIDAB in the last 72 hours. Anemia Panel: No results for input(s): VITAMINB12, FOLATE, FERRITIN, TIBC, IRON, RETICCTPCT in the last 72 hours. Sepsis Labs: Recent Labs  Lab 05/08/19 2359 05/09/19 0640  LATICACIDVEN 0.9 0.9    Recent Results (from the past 240 hour(s))  Respiratory Panel by RT PCR (Flu A&B, Covid) - Nasopharyngeal Swab     Status: None   Collection Time: 05/09/19 12:49 PM   Specimen: Nasopharyngeal Swab  Result Value Ref Range Status   SARS Coronavirus 2 by RT PCR NEGATIVE NEGATIVE Final    Comment: (NOTE) SARS-CoV-2 target nucleic acids are NOT DETECTED. The SARS-CoV-2 RNA is generally detectable in upper respiratoy specimens during the acute phase of infection. The lowest concentration of SARS-CoV-2 viral copies this assay can detect is 131 copies/mL. A negative result does not preclude SARS-Cov-2 infection and should not be used as the sole basis for treatment or other patient management decisions. A negative result may occur with  improper specimen collection/handling, submission of specimen other than nasopharyngeal swab, presence of viral mutation(s) within the areas targeted by this assay, and inadequate number of viral copies (<131 copies/mL). A negative result must be combined with clinical observations, patient history, and epidemiological information. The expected result is Negative. Fact Sheet for Patients:  05/11/19 Fact Sheet for Healthcare Providers:  https://www.Thayer.com/ This test is not yet ap proved or cleared by the https://www.young.biz/  FDA and  has been authorized for detection and/or diagnosis of SARS-CoV-2 by FDA under an Emergency Use Authorization (EUA). This EUA will remain  in effect (meaning this test can be used) for the duration of the COVID-19 declaration under Section 564(b)(1) of the Act, 21 U.S.C. section 360bbb-3(b)(1), unless the authorization is terminated or revoked sooner.    Influenza A by PCR NEGATIVE NEGATIVE Final   Influenza B by PCR NEGATIVE NEGATIVE Final    Comment: (NOTE) The Xpert Xpress  SARS-CoV-2/FLU/RSV assay is intended as an aid in  the diagnosis of influenza from Nasopharyngeal swab specimens and  should not be used as a sole basis for treatment. Nasal washings and  aspirates are unacceptable for Xpert Xpress SARS-CoV-2/FLU/RSV  testing. Fact Sheet for Patients: https://www.Justo.com/ Fact Sheet for Healthcare Providers: https://www.young.biz/ This test is not yet approved or cleared by the [place] FDA and  has been authorized for detection and/or diagnosis of SARS-CoV-2 by  FDA under an Emergency Use Authorization (EUA). This EUA will remain  in effect (meaning this test can be used) for the duration of the  Covid-19 declaration under Section 564(b)(1) of the Act, 21  U.S.C. section 360bbb-3(b)(1), unless the authorization is  terminated or revoked. Performed at [facility], [address] [place], [place] [address]   Culture, blood (Routine X 2) w Reflex to ID Panel     Status: None (Preliminary result)   Collection Time: 05/09/19  2:33 PM   Specimen: BLOOD  Result Value Ref Range Status   Specimen Description BLOOD LEFT ANTECUBITAL  Final   Special Requests   Final    BOTTLES DRAWN AEROBIC AND ANAEROBIC Blood Culture results may not be optimal due to an excessive volume of blood received in culture bottles   Culture   Final    NO GROWTH 3 DAYS Performed at [facility], [address] [place], [place] [address]      Report Status PENDING  Incomplete  Culture, blood (Routine X 2) w Reflex to ID Panel     Status: None (Preliminary result)   Collection Time: 05/09/19  2:44 PM   Specimen: BLOOD  Result Value Ref Range Status   Specimen Description BLOOD RIGHT ANTECUBITAL  Final   Special Requests   Final    BOTTLES DRAWN AEROBIC AND ANAEROBIC Blood Culture results may not be optimal due to an inadequate volume of blood received in culture bottles   Culture   Final    NO GROWTH 3 DAYS Performed at [facility], [address], [place], [place] [address]    Report Status PENDING  Incomplete  Surgical PCR screen     Status: None   Collection Time: 05/09/19  3:50 PM   Specimen: Nasal Mucosa; Nasal Swab  Result Value Ref Range Status   MRSA, PCR NEGATIVE NEGATIVE Final   Staphylococcus aureus NEGATIVE NEGATIVE Final    Comment: (NOTE) The Xpert SA Assay (FDA approved for NASAL specimens in patients 35 years of age and older), is one component of a comprehensive surveillance program. It is not intended to diagnose infection nor to guide or monitor treatment. Performed at [facility], [address], [place], [place] [address]   Aerobic/Anaerobic Culture (surgical/deep wound)     Status: None (Preliminary result)   Collection Time: 05/09/19  5:21 PM   Specimen: Wound  Result Value Ref Range Status   Specimen Description HAND  Final   Special Requests LEFT SAMPLE A  Final   Gram Stain   Final    RARE WBC PRESENT, PREDOMINANTLY PMN NO ORGANISMS SEEN    Culture   Final    NO GROWTH 2 DAYS Performed at [facility], [address] [place], [place] [address]    Report Status PENDING  Incomplete  Aerobic/Anaerobic Culture (surgical/deep wound)     Status: None (Preliminary result)   Collection Time: 05/09/19  5:45 PM   Specimen: Wound  Result Value Ref Range  Status   Specimen Description WOUND  Final   Special Requests LEFT TRAPEZOID SAMPLE B  Final   Gram Stain   Final     RARE WBC PRESENT, PREDOMINANTLY PMN NO ORGANISMS SEEN Performed at [facility], [place] [address] [place], [place] [address]    Culture   Final    RARE STAPHYLOCOCCUS AUREUS NO ANAEROBES ISOLATED; CULTURE IN PROGRESS FOR 5 DAYS    Report Status PENDING  Incomplete         Radiology Studies: [place] EKG SITE RITE  Result Date: 05/11/2019 If Site Rite image not attached, placement could not be confirmed due to current cardiac rhythm.       Scheduled Meds: . pantoprazole  40 mg Oral Daily   Continuous Infusions: . ceFEPime (MAXIPIME) IV 2 g (05/12/19 0514)  . vancomycin 150 mL/hr at 05/12/19 0300     LOS: 3 days    Time spent:40 min    Tawsha Terrero, Geraldo Docker, MD [facility] Pager [phone]  If 7PM-7AM, please contact night-coverage [email or web address] Password [email or web address] 05/12/2019, 9:02 AM

## 2019-05-12 NOTE — Progress Notes (Signed)
Pharmacy Antibiotic Note  David Galloway is a 44 y.o. male admitted on 05/08/2019 with osteomyelitis and cellulitis on hand after dog attack 10 days prior. Pt was initially sent home on augmentin. Pharmacy has been consulted for vancomycin and cefepime dosing.   Today, pt is afebrile and WBC is WNL. SCr stable at baseline  Plan: Cefepime 2gm IV Q8H Vancomycin 750mg  IV Q12H (est AUC 425 using SCr 1.03 and TBW for Ke estimate) F/u renal fxn, C&S, clinical status and vanc peak/trough at SS (vP and vT ordered around next dose) F/u staph susceptibilities to determine final antibiotic selection for OPAT orders (likely will remain on IV antibiotics through 4/27)   Height: 6' (182.9 cm) Weight: 145 lb (65.8 kg) IBW/kg (Calculated) : 77.6  Temp (24hrs), Avg:98.6 F (37 C), Min:98.5 F (36.9 C), Max:98.6 F (37 C)  Recent Labs  Lab 05/08/19 2337 05/08/19 2359 05/09/19 0640 05/10/19 0219 05/12/19 0242  WBC 9.3  --   --  11.6* 9.4  CREATININE 1.05  --   --  1.01 1.03  LATICACIDVEN  --  0.9 0.9  --   --     Estimated Creatinine Clearance: 84.3 mL/min (by C-G formula based on SCr of 1.03 mg/dL).    No Known Allergies  Antimicrobials this admission: Vanc 3/17>> Cefepime 3/17>> Unasyn x 1 3/17  Dose adjustments this admission: N/A  Microbiology results: 3/17: Hand>>ngtd 3/17: wound>>rare SA (pending susceptibility) 3/17: BC x 2>>ngtd 3/17 MRSA PCR: negative   4/17, PharmD PGY2 Pharmacy Resident Phone [phone]  05/12/2019   11:17 AM

## 2019-05-12 NOTE — Progress Notes (Signed)
Occupational Therapy Treatment Patient Details Name: David Galloway MRN: [id number] DOB: February 18, 1976 Today's Date: 05/12/2019    History of present illness Pt is a 44 y/o male with PMH of tobacco abuse, marijuana abuse, sickle cell trait, duodenal ulcer due to  Helicobacter pylori presents to emergency department due to left hand swelling for 10 days (reporting being attacked by a dog). S/P I&D L dorsal hand and wrist abscess, L radical tenosynovectomy of 2nd, 3rd and 4th dorsal compartments, L bone biopsy of dorsal trapezoid. Per ortho early digit and wrist ROM as tolerated.    OT comments  Pt seen for OT f/u with focus on L hand ROM/exercise. Reviewed HEP pt was given at previous session with good carry over. Added additional exercises to focus on composite flexion and extension using the table and a wash cloth (see exercise section below for full details). Pt tolerated progression of exercises well. Pt largely needs to focus on extension and composite movements. Continued to educated pt on importance of mobilizing and stretching shoulder to prevent residual tightness from nonuse. Recommend OP OT as cleared by surgeon for continued progression of LUE rehab to return to work. Will continue to follow acutely.    Follow Up Recommendations  Follow surgeon's recommendation for DC plan and follow-up therapies;Outpatient OT    Equipment Recommendations  None recommended by OT    Recommendations for Other Services      Precautions / Restrictions Precautions Precautions: None Restrictions Weight Bearing Restrictions: No       Mobility Bed Mobility Overal bed mobility: Independent                Transfers Overall transfer level: Independent                    Balance Overall balance assessment: Independent                                         ADL either performed or assessed with clinical judgement   ADL Overall ADL's : Modified independent                                      Functional mobility during ADLs: Modified independent General ADL Comments: session focused on L UE exercises      Vision       Perception     Praxis      Cognition Arousal/Alertness: Awake/alert Behavior During Therapy: WFL for tasks assessed/performed Overall Cognitive Status: Within Functional Limits for tasks assessed                                          Exercises Hand Exercises Forearm Supination: AROM;Left;10 reps;Seated Forearm Pronation: AROM;Left;10 reps;Seated Wrist Flexion: AROM;5 reps;Seated Wrist Extension: AROM;Self ROM;10 reps;Seated(taught use of self ROM) Digit Composite Flexion: AROM;Left;10 reps;Seated Composite Extension: 10 reps;Left;AAROM;Seated;Self ROM(taught self ROM, pt able to initiate but needing additional support to continue full movement) Digit Composite Abduction: AROM;Left;10 reps;Seated Digit Composite Adduction: AROM;Right;10 reps;Seated Hand Activities Turning Pages: Left;10 reps;Standing Other Exercises Other Exercises: grasp and release was cloth for digit composite flexion/extension x5   Shoulder Instructions       General Comments      Pertinent Vitals/  Pain       Pain Assessment: Faces Faces Pain Scale: Hurts a little bit Pain Location: L hand with movement  Pain Descriptors / Indicators: Discomfort;Sore;Operative site guarding Pain Intervention(s): Monitored during session  Home Living                                          Prior Functioning/Environment              Frequency  Min 3X/week        Progress Toward Goals  OT Goals(current goals can now be found in the care plan section)  Progress towards OT goals: Progressing toward goals  Acute Rehab OT Goals Patient Stated Goal: to get my L hand feeling better OT Goal Formulation: With patient Time For Goal Achievement: 05/24/19 Potential to Achieve Goals: Good  Plan  Discharge plan remains appropriate;Frequency needs to be updated    Co-evaluation                 AM-PAC OT "6 Clicks" Daily Activity     Outcome Measure   Help from another person eating meals?: None Help from another person taking care of personal grooming?: None Help from another person toileting, which includes using toliet, bedpan, or urinal?: None Help from another person bathing (including washing, rinsing, drying)?: None Help from another person to put on and taking off regular upper body clothing?: None Help from another person to put on and taking off regular lower body clothing?: None 6 Click Score: 24    End of Session    OT Visit Diagnosis: Pain;Muscle weakness (generalized) (M62.81) Pain - Right/Left: Left Pain - part of body: Arm;Hand   Activity Tolerance Patient tolerated treatment well   Patient Left in bed;with call bell/phone within reach   Nurse Communication Mobility status;Precautions        Time: 9629-5284 OT Time Calculation (min): 27 min  Charges: OT General Charges $OT Visit: 1 Visit OT Treatments $Therapeutic Exercise: 23-37 mins  Dalphine Handing, MSOT, OTR/L Acute Rehabilitation Services [facility] Office Number: [phone] Pager: [phone]  Dalphine Handing 05/12/2019, 5:58 PM

## 2019-05-13 DIAGNOSIS — A4901 Methicillin susceptible Staphylococcus aureus infection, unspecified site: Secondary | ICD-10-CM

## 2019-05-13 LAB — CBC WITH DIFFERENTIAL/PLATELET
Abs Immature Granulocytes: 0.07 10*3/uL (ref 0.00–0.07)
Basophils Absolute: 0.1 10*3/uL (ref 0.0–0.1)
Basophils Relative: 1 %
Eosinophils Absolute: 0.4 10*3/uL (ref 0.0–0.5)
Eosinophils Relative: 4 %
HCT: 35.4 % — ABNORMAL LOW (ref 39.0–52.0)
Hemoglobin: 12.7 g/dL — ABNORMAL LOW (ref 13.0–17.0)
Immature Granulocytes: 1 %
Lymphocytes Relative: 25 %
Lymphs Abs: 2.4 10*3/uL (ref 0.7–4.0)
MCH: 28.3 pg (ref 26.0–34.0)
MCHC: 35.9 g/dL (ref 30.0–36.0)
MCV: 79 fL — ABNORMAL LOW (ref 80.0–100.0)
Monocytes Absolute: 1.1 10*3/uL — ABNORMAL HIGH (ref 0.1–1.0)
Monocytes Relative: 11 %
Neutro Abs: 5.8 10*3/uL (ref 1.7–7.7)
Neutrophils Relative %: 58 %
Platelets: 366 10*3/uL (ref 150–400)
RBC: 4.48 MIL/uL (ref 4.22–5.81)
RDW: 13.5 % (ref 11.5–15.5)
WBC: 9.9 10*3/uL (ref 4.0–10.5)
nRBC: 0 % (ref 0.0–0.2)

## 2019-05-13 LAB — COMPREHENSIVE METABOLIC PANEL
ALT: 14 U/L (ref 0–44)
AST: 13 U/L — ABNORMAL LOW (ref 15–41)
Albumin: 3.2 g/dL — ABNORMAL LOW (ref 3.5–5.0)
Alkaline Phosphatase: 48 U/L (ref 38–126)
Anion gap: 9 (ref 5–15)
BUN: 13 mg/dL (ref 6–20)
CO2: 27 mmol/L (ref 22–32)
Calcium: 9 mg/dL (ref 8.9–10.3)
Chloride: 104 mmol/L (ref 98–111)
Creatinine, Ser: 1 mg/dL (ref 0.61–1.24)
GFR calc Af Amer: >60 mL/min (ref >60)
GFR calc non Af Amer: >60 mL/min (ref >60)
Glucose, Bld: 118 mg/dL — ABNORMAL HIGH (ref 70–99)
Potassium: 3.9 mmol/L (ref 3.5–5.1)
Sodium: 140 mmol/L (ref 135–145)
Total Bilirubin: 0.5 mg/dL (ref 0.3–1.2)
Total Protein: 6.2 g/dL — ABNORMAL LOW (ref 6.5–8.1)

## 2019-05-13 LAB — MAGNESIUM: Magnesium: 2 mg/dL (ref 1.7–2.4)

## 2019-05-13 LAB — VANCOMYCIN, TROUGH: Vancomycin Tr: 6 ug/mL — ABNORMAL LOW (ref 15–20)

## 2019-05-13 LAB — PHOSPHORUS: Phosphorus: 4.3 mg/dL (ref 2.5–4.6)

## 2019-05-13 MED ORDER — CEFAZOLIN SODIUM-DEXTROSE 2-4 GM/100ML-% IV SOLN
2.0000 g | Freq: Three times a day (TID) | INTRAVENOUS | Status: DC
  Administered 2019-05-13 – 2019-05-15 (×7): 2 g via INTRAVENOUS
  Filled 2019-05-13 (×8): qty 100

## 2019-05-13 NOTE — Progress Notes (Signed)
Pharmacy Antibiotic Note  David Galloway is a 44 y.o. male admitted on 05/08/2019 with osteomyelitis and cellulitis on hand after dog attack 10 days prior. Pt was initially sent home on Augmentin and started on vancomycin and cefepime once admitted. Now wound culture has resulted with MSSA, and pharmacy has been consulted to de-escalate to cefazolin and continue as outpatient parenteral antibiotic therapy through 06/19/2019.  Today, patient remains afebrile with WBC WNL. SCr stable at baseline  Plan: Discontinue cefepime and vancomycin  Start cefazolin IV 2g q8h  plan to continue as OPAT until 06/19/19 Monitor clinical picture, renal function   Height: 6' (182.9 cm) Weight: 145 lb (65.8 kg) IBW/kg (Calculated) : 77.6  Temp (24hrs), Avg:98.6 F (37 C), Min:98.1 F (36.7 C), Max:98.8 F (37.1 C)  Recent Labs  Lab 05/08/19 2337 05/08/19 2359 05/09/19 0640 05/10/19 0219 05/12/19 0242 05/12/19 1648 05/13/19 0222  WBC 9.3  --   --  11.6* 9.4  --  9.9  CREATININE 1.05  --   --  1.01 1.03  --  1.00  LATICACIDVEN  --  0.9 0.9  --   --   --   --   VANCOTROUGH  --   --   --   --   --   --  6*  VANCOPEAK  --   --   --   --   --  33  --     Estimated Creatinine Clearance: 86.8 mL/min (by C-G formula based on SCr of 1 mg/dL).    No Known Allergies  Antimicrobials this admission: Vanc 3/17>> 3/21 Cefepime 3/17>> 3/21 Unasyn x 1 3/17 Cefazolin 3/21>  Microbiology results: 3/17: Hand>>ngtd 3/17: wound>>MSSA 3/17: BC x 2>>ngtd 3/17 MRSA PCR: negative   Harlow Mares, PharmD PGY2 Pharmacy Resident Phone [phone]  05/13/2019   10:42 AM

## 2019-05-13 NOTE — Progress Notes (Signed)
Pharmacy Antibiotic Note  David Galloway is a 44 y.o. male admitted on 05/08/2019 with osteomyelitis and cellulitis on hand after dog attack 10 days prior. Pt was initially sent home on augmentin. Pharmacy has been consulted for vancomycin and cefepime dosing.   Vanc peak 33, VT 6  Calc AUC 511.0  Continue current vanc regimen Thanks for allowing pharmacy to be a part of this patient's care.  Talbert Cage, PharmD Clinical Pharmacist

## 2019-05-13 NOTE — Plan of Care (Signed)

## 2019-05-13 NOTE — TOC Progression Note (Addendum)
Transition of Care [facility]) - Progression Note    Patient Details  Name: David Galloway MRN: [id number] Date of Birth: Mar 09, 1975  Transition of Care [facility]) CM/SW Contact  Lorri Frederick, LCSW Phone Number: 05/13/2019, 12:09 PM  Clinical Narrative:   CSW spoke with patient about housing issues and substance use issues.  Pt reports he has been in his current house for 2 years and at his current job for 15 years.  He is not in danger of losing his job, but he is going to miss some work with the current situation and he is not sure how he will pay rent next month.  Pt reports he has not discussed the current situation with his landlord.  He has not paid his rent for March but he has a check from his job waiting and can cover the rent for march.  CSW discussed [employer] as a resource for help if needed and pt is agreeable to try to work out situation with his landlord and call housing coalition if needed.   Regarding substance use, pt minimized his use, finally admitted that he smokes marijuana daily, usually one blunt.  Pt reports that his use has not caused him any problems and he does not need any help to quit but he is not particularly motivated to quit currently.   Contact information for [facility], shelter resources and Pharmacist, hospital for outpatient substance abuse providers provided.   Per Dr Joseph Art, pt will be out of work for 6 weeks.  Pt uninsured.  CSW talked with pt about any disability insurance he has through his employer ([employer])  Pt reports he declined all insurance options.  Advised him to contact HR and [employer] to see if anything would be available.  Advised again to contact [facility] and [employer] for potential rent assistance while he is recovering.      Expected Discharge Plan: Home w Home Health Services Barriers to Discharge: Continued Medical Work up  Expected Discharge Plan and Services Expected Discharge Plan: Home w Home Health  Services In-house Referral: Artist Discharge Planning Services: CM Consult Post Acute Care Choice: Home Health Living arrangements for the past 2 months: Single Family Home                 DME Arranged: N/A         HH Arranged: NA HH Agency: [facility] Date HH Agency Contacted: 05/10/19 Time HH Agency Contacted: 1245 Representative spoke with at [facility] Agency: Pam   Social Determinants of Health (SDOH) Interventions    Readmission Risk Interventions No flowsheet data found.

## 2019-05-13 NOTE — Progress Notes (Signed)
Now IDed as MSSA and changing to cefazolin OPAT ordered. He has follow up with me arranged. Gardiner Barefoot, MD

## 2019-05-13 NOTE — Progress Notes (Addendum)
Occupational Therapy Treatment Patient Details Name: David Galloway MRN: [id number] DOB: 02-25-1975 Today's Date: 05/13/2019    History of present illness Pt is a 44 y/o male with PMH of tobacco abuse, marijuana abuse, sickle cell trait, duodenal ulcer due to  Helicobacter pylori presents to emergency department due to left hand swelling for 10 days (reporting being attacked by a dog). S/P I&D L dorsal hand and wrist abscess, L radical tenosynovectomy of 2nd, 3rd and 4th dorsal compartments, L bone biopsy of dorsal trapezoid. Per ortho early digit and wrist ROM as tolerated.    OT comments  Pt. Seen for skilled OT treatment session.  Focus of session remains pt. Return demo of L hand/digit exercises and ROM with good technique.  Pt. With good recall from previous session.  Some reinforcement for assisting with extension of digits.  Provided blue [facility] squeeze ball for use during HEP.  Pt. Tolerating well and eager for continued progression of ROM of left digits.    Follow Up Recommendations  Follow surgeon's recommendation for DC plan and follow-up therapies;Outpatient OT    Equipment Recommendations  None recommended by OT    Recommendations for Other Services      Precautions / Restrictions Precautions Precautions: None       Mobility Bed Mobility                  Transfers                      Balance                                           ADL either performed or assessed with clinical judgement   ADL                                               Vision       Perception     Praxis      Cognition Arousal/Alertness: Awake/alert Behavior During Therapy: WFL for tasks assessed/performed Overall Cognitive Status: Within Functional Limits for tasks assessed                                          Exercises Hand Exercises Forearm Supination: AROM;Left;10 reps;Seated Forearm Pronation:  AROM;Left;10 reps;Seated Wrist Flexion: AROM;5 reps;Seated Wrist Extension: AROM;Self ROM;10 reps;Seated Digit Composite Flexion: AROM;Left;10 reps;Seated Composite Extension: 10 reps;Left;AAROM;Seated;Self ROM Digit Composite Abduction: AROM;Left;10 reps;Seated Digit Composite Adduction: AROM;Right;10 reps;Seated Thumb Abduction: AROM;Left;10 reps;Seated Thumb Adduction: AROM;Left;10 reps;Seated Opposition: AROM;Seated;Left;10 reps Other Exercises Other Exercises: provided blue squeeze ball. educated need to cont. extension of digits with assistance from other hand and not just do the grasping motion with the ball.  Additional education and demonstration provided for integration of use of L hand/digits during adls. Ie: opening packages at meal times, grooming tasks, holding phone and texting with digits.  Turning pages of books.  Reinforced not defaulting to R hand, bring the L hand in as able.    Shoulder Instructions       General Comments  reports he is an avid animal lover has several dogs and cats. Has managed multiple litters also.  Wanted it clarified the dog that bit him was not his.  He is passionate about his pets and this was a dog that approached him on the street by a park.      Pertinent Vitals/ Pain       Pain Assessment: No/denies pain  Home Living                                          Prior Functioning/Environment              Frequency  Min 3X/week        Progress Toward Goals  OT Goals(current goals can now be found in the care plan section)  Progress towards OT goals: Progressing toward goals     Plan Discharge plan remains appropriate;Frequency needs to be updated    Co-evaluation                 AM-PAC OT "6 Clicks" Daily Activity     Outcome Measure   Help from another person eating meals?: None Help from another person taking care of personal grooming?: None Help from another person toileting, which includes  using toliet, bedpan, or urinal?: None Help from another person bathing (including washing, rinsing, drying)?: None Help from another person to put on and taking off regular upper body clothing?: None Help from another person to put on and taking off regular lower body clothing?: None 6 Click Score: 24    End of Session    OT Visit Diagnosis: Pain;Muscle weakness (generalized) (M62.81) Pain - Right/Left: Left Pain - part of body: Arm;Hand   Activity Tolerance Patient tolerated treatment well   Patient Left in bed;with call bell/phone within reach   Nurse Communication          Time: 1010-1030 OT Time Calculation (min): 20 min  Charges: OT General Charges $OT Visit: 1 Visit OT Treatments $Therapeutic Exercise: 8-22 mins  Sonia Baller, COTA/L Acute Rehabilitation [phone]   Janice Coffin 05/13/2019, 11:44 AM

## 2019-05-13 NOTE — Progress Notes (Signed)
PHARMACY CONSULT NOTE FOR:  OUTPATIENT  PARENTERAL ANTIBIOTIC THERAPY (OPAT)  Indication: osteomyelitis Regimen: cefazolin IV 2g every 8 hours End date: 06/19/2019  IV antibiotic discharge orders are pended. To discharging provider:  please sign these orders via discharge navigator,  Select New Orders & click on the button choice - Manage This Unsigned Work.     Thank you for allowing pharmacy to be a part of this patient's care.  David Galloway 05/13/2019, 11:05 AM

## 2019-05-13 NOTE — Progress Notes (Signed)
PROGRESS NOTE    David Galloway  PYP:[id number] DOB: 06/30/1975 DOA: 05/08/2019 PCP: Patient, No Pcp Per     Brief Narrative:  44 year old male PMHx Tobacco abuse, Marijuana abuse, Sickle cell trait, Hx Duodenal ulcers.   Presented with left hand edema.   He presented with 10 days of left hand edema, apparently he suffered dog bites in his left hand, and face.  At that time his lacerations were repaired and he received Augmentin for antibiotic therapy.  For the last 48 hours his left hand has become more edematous and tender.  On his initial physical examination his blood pressure was 135/85, heart rate 81, respiratory rate 16, temperature 98.6, oxygen saturation 99%.  His lungs are clear to auscultation bilaterally, heart S1-S2 present rhythmic, his abdomen was soft, no lower extremity edema, his left hand was edematous, tender to palpation.  Sodium 142, potassium 3.1, chloride 108, bicarb 24, glucose 103, BUN 12, creatinine 1.0, white count 9.3, hemoglobin 13.4, hematocrit 37.3, platelets 393.  SARS COVID-19 negative.    Left hand x-ray with moderate soft tissue swelling of the dorsum of the left hand, progressed from prior imaging.  No radiographic evidence of osteomyelitis.  Left hand MRI with subcutaneous abscess over the metacarpals.  Septic tenosynovitis of the compartment 2, 3 and 4 extensors, worse in compartment 4.  Marrow edema and enhancement of the dorsal aspects of the capitate and trapezoid bones most consistent with osteomyelitis.   Subjective: 3/21 A/O x4, negative CP, negative S OB.  Left hand pain decreased from 3/20   Assessment & Plan:   Principal Problem:   Cellulitis of left hand Active Problems:   Sickle cell trait (HCC)   Hypokalemia   Tobacco abuse   Marijuana abuse   Duodenal ulcer due to Helicobacter pylori  Left hand cellulitis, tenosynovitis, and capitate/episode bones osteomyelitis -3/17 sp surgical irrigation, tenosynovectomy and bone biopsy.  -3/20  decreasing inflammation left hand and forearm able to open and close hand.  -Continue leukocytosis however patient understands he will be on long-term IV antibiotics -3/20 per ID Dr. Herbie Baltimore Comar patient will need to continue IV vancomycin and cefepime End date April 27  -PT recommends no follow-up -3/21 LCSW; working on obtaining home IV antibiotics, and disability.   Hypokalemia -Resolved  Tobacco abuse -Patient counseled on smoking cessation    Hx PUD secondary to H. pylori. -Protonix 40 mg daily  Marijuana abuse -3/19 urine tox screen positive marijuana -We will counsel patient on need to discontinue  Goals of care -3/19 consult PT; recommends no follow-up -3/19 LCSW consult; patient with serious dog bite which will require long-term antibiotics and follow-up.  Largest concern is patient losing his house secondary to not being able to work. -3/20 LCSW consult; second request for social work to see patient   DVT prophylaxis: SCD Code Status: Full Family Communication:  Disposition Plan: TBD awaiting recommendation from LCSW and advanced home care 1.  Where the patient is from home 2.  Anticipated d/c place. 3.  Barriers to d/c; 3/21 LCSW; working on obtaining home IV antibiotics, and disability    Consultants:  ID Dr. Martyn Ehrich Hand surgeon Dr.James J. Creighton, III    Procedures/Significant Events:  3/17 SURGICAL PROCEDURES: Dr.James J. Creighton, III 1.  Irrigation treatment of left dorsal hand and wrist abscess 2.  Left radical tenosynovectomy of the second, third and fourth dorsal compartments 3.  Left bone biopsy/saucerization of the dorsal trapezoid    I have personally reviewed and interpreted  all radiology studies and my findings are as above.  VENTILATOR SETTINGS:    Cultures 3/17 blood LEFT AC NGTD 3/17 blood RIGHT AC NGTD 3/17 LEFT hand NGTD 3/17 LEFT trapezoid sample positive rare staph aureus     Antimicrobials: Anti-infectives  (From admission, onward)   Start     Dose/Rate Stop   05/10/19 0300  vancomycin (VANCOREADY) IVPB 750 mg/150 mL     750 mg 150 mL/hr over 60 Minutes     05/09/19 1330  ceFEPIme (MAXIPIME) 2 g in sodium chloride 0.9 % 100 mL IVPB     2 g 200 mL/hr over 30 Minutes     05/09/19 1315  vancomycin (VANCOREADY) IVPB 1500 mg/300 mL     1,500 mg 150 mL/hr over 120 Minutes 05/09/19 1814   05/09/19 0800  cefTRIAXone (ROCEPHIN) 1 g in sodium chloride 0.9 % 100 mL IVPB  Status:  Discontinued     1 g 200 mL/hr over 30 Minutes 05/09/19 0717   05/09/19 0800  Ampicillin-Sulbactam (UNASYN) 3 g in sodium chloride 0.9 % 100 mL IVPB     3 g 200 mL/hr over 30 Minutes 05/09/19 0943       Devices    LINES / TUBES:  PICC RIGHT Brachial 3/20>>>    Continuous Infusions: . ceFEPime (MAXIPIME) IV 2 g (05/13/19 0519)  . vancomycin Stopped (05/13/19 0357)     Objective: Vitals:   05/12/19 0535 05/12/19 1538 05/12/19 2234 05/13/19 0422  BP: 122/88 (!) 135/101 119/77 123/80  Pulse: 64 71 72 70  Resp: 14 16 17 16   Temp: 98.1 F (36.7 C) 98.1 F (36.7 C) 98.8 F (37.1 C) 98.8 F (37.1 C)  TempSrc: Oral Oral Oral Oral  SpO2: 97% 100% 100% 97%  Weight:      Height:        Intake/Output Summary (Last 24 hours) at 05/13/2019 1021 Last data filed at 05/13/2019 05/15/2019 Gross per 24 hour  Intake 1290.73 ml  Output --  Net 1290.73 ml   Filed Weights   05/09/19 05/11/19 05/09/19 2026  Weight: 65.8 kg 65.8 kg   Physical Exam:  General: A/O x4, no acute respiratory distress Eyes: negative scleral hemorrhage, negative anisocoria, negative icterus ENT: Negative Runny nose, negative gingival bleeding, Neck:  Negative scars, masses, torticollis, lymphadenopathy, JVD Lungs: Clear to auscultation bilaterally without wheezes or crackles Cardiovascular: Regular rate and rhythm without murmur gallop or rub normal S1 and S2 Abdomen: negative abdominal pain, nondistended, positive soft, bowel sounds, no  rebound, no ascites, no appreciable mass Extremities: LEFT hand and forearm swollen and warm to touch.  Able to make a fist with his hand significantly decreased pain from 3/20 Skin: Negative rashes, lesions, ulcers Psychiatric:  Negative depression, negative anxiety, negative fatigue, negative mania  Central nervous system:  Cranial nerves II through XII intact, tongue/uvula midline, all extremities muscle strength 5/5, sensation intact throughout, negative dysarthria, negative expressive aphasia, negative receptive aphasia.   .     Data Reviewed: Care during the described time interval was provided by me .  I have reviewed this patient's available data, including medical history, events of note, physical examination, and all test results as part of my evaluation.  CBC: Recent Labs  Lab 05/08/19 2337 05/10/19 0219 05/12/19 0242 05/13/19 0222  WBC 9.3 11.6* 9.4 9.9  NEUTROABS 5.6  --  5.2 5.8  HGB 13.4 12.3* 13.1 12.7*  HCT 37.3* 34.8* 37.3* 35.4*  MCV 79.5* 79.5* 78.9* 79.0*  PLT 393 374 373  366   Basic Metabolic Panel: Recent Labs  Lab 05/08/19 2337 05/09/19 1444 05/10/19 0219 05/12/19 0242 05/13/19 0222  NA 142  --  139 141 140  K 3.1*  --  4.3 3.6 3.9  CL 108  --  105 103 104  CO2 24  --  23 27 27   GLUCOSE 103*  --  134* 113* 118*  BUN 12  --  9 11 13   CREATININE 1.05  --  1.01 1.03 1.00  CALCIUM 8.6*  --  8.4* 8.8* 9.0  MG  --  2.0  --  2.1 2.0  PHOS  --   --   --  4.9* 4.3   GFR: Estimated Creatinine Clearance: 86.8 mL/min (by C-G formula based on SCr of 1 mg/dL). Liver Function Tests: Recent Labs  Lab 05/08/19 2337 05/10/19 0219 05/12/19 0242 05/13/19 0222  AST 17 12* 13* 13*  ALT 16 15 13 14   ALKPHOS 54 47 53 48  BILITOT 0.4 0.5 0.5 0.5  PROT 6.7 5.8* 5.9* 6.2*  ALBUMIN 3.4* 2.9* 3.1* 3.2*   No results for input(s): LIPASE, AMYLASE in the last 168 hours. No results for input(s): AMMONIA in the last 168 hours. Coagulation Profile: No results for  input(s): INR, PROTIME in the last 168 hours. Cardiac Enzymes: No results for input(s): CKTOTAL, CKMB, CKMBINDEX, TROPONINI in the last 168 hours. BNP (last 3 results) No results for input(s): PROBNP in the last 8760 hours. HbA1C: No results for input(s): HGBA1C in the last 72 hours. CBG: No results for input(s): GLUCAP in the last 168 hours. Lipid Profile: No results for input(s): CHOL, HDL, LDLCALC, TRIG, CHOLHDL, LDLDIRECT in the last 72 hours. Thyroid Function Tests: No results for input(s): TSH, T4TOTAL, FREET4, T3FREE, THYROIDAB in the last 72 hours. Anemia Panel: No results for input(s): VITAMINB12, FOLATE, FERRITIN, TIBC, IRON, RETICCTPCT in the last 72 hours. Sepsis Labs: Recent Labs  Lab 05/08/19 2359 05/09/19 0640  LATICACIDVEN 0.9 0.9    Recent Results (from the past 240 hour(s))  Respiratory Panel by RT PCR (Flu A&B, Covid) - Nasopharyngeal Swab     Status: None   Collection Time: 05/09/19 12:49 PM   Specimen: Nasopharyngeal Swab  Result Value Ref Range Status   SARS Coronavirus 2 by RT PCR NEGATIVE NEGATIVE Final    Comment: (NOTE) SARS-CoV-2 target nucleic acids are NOT DETECTED. The SARS-CoV-2 RNA is generally detectable in upper respiratoy specimens during the acute phase of infection. The lowest concentration of SARS-CoV-2 viral copies this assay can detect is 131 copies/mL. A negative result does not preclude SARS-Cov-2 infection and should not be used as the sole basis for treatment or other patient management decisions. A negative result may occur with  improper specimen collection/handling, submission of specimen other than nasopharyngeal swab, presence of viral mutation(s) within the areas targeted by this assay, and inadequate number of viral copies (<131 copies/mL). A negative result must be combined with clinical observations, patient history, and epidemiological information. The expected result is Negative. Fact Sheet for Patients:    05/10/19 Fact Sheet for Healthcare Providers:  05/11/19 This test is not yet ap proved or cleared by the 05/11/19 FDA and  has been authorized for detection and/or diagnosis of SARS-CoV-2 by FDA under an Emergency Use Authorization (EUA). This EUA will remain  in effect (meaning this test can be used) for the duration of the COVID-19 declaration under Section 564(b)(1) of the Act, 21 U.S.C. section 360bbb-3(b)(1), unless the authorization is terminated or revoked  sooner.    Influenza A by PCR NEGATIVE NEGATIVE Final   Influenza B by PCR NEGATIVE NEGATIVE Final    Comment: (NOTE) The Xpert Xpress SARS-CoV-2/FLU/RSV assay is intended as an aid in  the diagnosis of influenza from Nasopharyngeal swab specimens and  should not be used as a sole basis for treatment. Nasal washings and  aspirates are unacceptable for Xpert Xpress SARS-CoV-2/FLU/RSV  testing. Fact Sheet for Patients: https://www.Mccarthy.com/https://www.fda.gov/media/142436/download Fact Sheet for Healthcare Providers: https://www.young.biz/https://www.fda.gov/media/142435/download This test is not yet approved or cleared by the [place] FDA and  has been authorized for detection and/or diagnosis of SARS-CoV-2 by  FDA under an Emergency Use Authorization (EUA). This EUA will remain  in effect (meaning this test can be used) for the duration of the  Covid-19 declaration under Section 564(b)(1) of the Act, 21  U.S.C. section 360bbb-3(b)(1), unless the authorization is  terminated or revoked. Performed at [facility], [address] [place], [place] [address]   Culture, blood (Routine X 2) w Reflex to ID Panel     Status: None (Preliminary result)   Collection Time: 05/09/19  2:33 PM   Specimen: BLOOD  Result Value Ref Range Status   Specimen Description BLOOD LEFT ANTECUBITAL  Final   Special Requests   Final    BOTTLES DRAWN AEROBIC AND ANAEROBIC Blood Culture results may not be  optimal due to an excessive volume of blood received in culture bottles   Culture   Final    NO GROWTH 4 DAYS Performed at [facility], [address] [place], [place] [address]    Report Status PENDING  Incomplete  Culture, blood (Routine X 2) w Reflex to ID Panel     Status: None (Preliminary result)   Collection Time: 05/09/19  2:44 PM   Specimen: BLOOD  Result Value Ref Range Status   Specimen Description BLOOD RIGHT ANTECUBITAL  Final   Special Requests   Final    BOTTLES DRAWN AEROBIC AND ANAEROBIC Blood Culture results may not be optimal due to an inadequate volume of blood received in culture bottles   Culture   Final    NO GROWTH 4 DAYS Performed at [facility], [address] [place], [place] [address]    Report Status PENDING  Incomplete  Surgical PCR screen     Status: None   Collection Time: 05/09/19  3:50 PM   Specimen: Nasal Mucosa; Nasal Swab  Result Value Ref Range Status   MRSA, PCR NEGATIVE NEGATIVE Final   Staphylococcus aureus NEGATIVE NEGATIVE Final    Comment: (NOTE) The Xpert SA Assay (FDA approved for NASAL specimens in patients 46 years of age and older), is one component of a comprehensive surveillance program. It is not intended to diagnose infection nor to guide or monitor treatment. Performed at [facility], [address] [place], [place] [address]   Aerobic/Anaerobic Culture (surgical/deep wound)     Status: None (Preliminary result)   Collection Time: 05/09/19  5:21 PM   Specimen: Wound  Result Value Ref Range Status   Specimen Description HAND  Final   Special Requests LEFT SAMPLE A  Final   Gram Stain   Final    RARE WBC PRESENT, PREDOMINANTLY PMN NO ORGANISMS SEEN    Culture   Final    NO GROWTH 4 DAYS NO ANAEROBES ISOLATED; CULTURE IN PROGRESS FOR 5 DAYS Performed at [facility], [address] [place], [place] [address]  Report Status PENDING  Incomplete  Aerobic/Anaerobic Culture (surgical/deep  wound)     Status: None (Preliminary result)   Collection Time: 05/09/19  5:45 PM   Specimen: Wound  Result Value Ref Range Status   Specimen Description WOUND  Final   Special Requests LEFT TRAPEZOID SAMPLE B  Final   Gram Stain   Final    RARE WBC PRESENT, PREDOMINANTLY PMN NO ORGANISMS SEEN Performed at [facility], [address], [place], [place] [address]    Culture   Final    RARE STAPHYLOCOCCUS AUREUS NO ANAEROBES ISOLATED; CULTURE IN PROGRESS FOR 5 DAYS    Report Status PENDING  Incomplete   Organism ID, Bacteria STAPHYLOCOCCUS AUREUS  Final      Susceptibility   Staphylococcus aureus - MIC*    CIPROFLOXACIN <=0.5 SENSITIVE Sensitive     ERYTHROMYCIN <=0.25 SENSITIVE Sensitive     GENTAMICIN <=0.5 SENSITIVE Sensitive     OXACILLIN <=0.25 SENSITIVE Sensitive     TETRACYCLINE <=1 SENSITIVE Sensitive     VANCOMYCIN <=0.5 SENSITIVE Sensitive     TRIMETH/SULFA <=10 SENSITIVE Sensitive     CLINDAMYCIN <=0.25 SENSITIVE Sensitive     RIFAMPIN <=0.5 SENSITIVE Sensitive     Inducible Clindamycin NEGATIVE Sensitive     * RARE STAPHYLOCOCCUS AUREUS         Radiology Studies: [place] EKG SITE RITE  Result Date: 05/11/2019 If Site Rite image not attached, placement could not be confirmed due to current cardiac rhythm.       Scheduled Meds: . Chlorhexidine Gluconate Cloth  6 each Topical Daily  . pantoprazole  40 mg Oral Daily   Continuous Infusions: . ceFEPime (MAXIPIME) IV 2 g (05/13/19 0519)  . vancomycin Stopped (05/13/19 0357)     LOS: 4 days    Time spent:40 min    Paiden Caraveo, Roselind Messier, MD [facility] Pager [phone]  If 7PM-7AM, please contact night-coverage [email or web address] Password [facility] 05/13/2019, 10:21 AM

## 2019-05-13 NOTE — TOC Progression Note (Addendum)
Transition of Care [facility]) - Progression Note    Patient Details  Name: WESSLEY EMERT MRN: [id number] Date of Birth: 16-Nov-1975  Transition of Care [facility]) CM/SW Contact  Deveron Furlong, RN 05/13/2019, 12:03 PM  Clinical Narrative:    Patient to be on long-term antibiotics (until 4/27). [facility] is aware and prepared to provide drugs/supplies.  Patient is uninsured and attempts to arrange charity home health services have not been successful.   Multiple agencies have declined patient referral including [facility], [facility], [facility], [facility], and [facility].  This exhausts hospital's charity [facility] providers.  [facility] will not not provide [facility] RN unless patient can pay for visits.  Pam, [facility] RN, states that [facility] may be able to staff patient later this week.  Expected Discharge Plan: Home w Home Health Services Barriers to Discharge: Continued Medical Work up  Expected Discharge Plan and Services Expected Discharge Plan: Home w Home Health Services In-house Referral: Artist Discharge Planning Services: CM Consult Post Acute Care Choice: Home Health Living arrangements for the past 2 months: Single Family Home                 DME Arranged: N/A         HH Arranged: NA HH Agency: [facility] Date HH Agency Contacted: 05/10/19 Time HH Agency Contacted: 1245 Representative spoke with at [facility] Agency: Pam   Social Determinants of Health (SDOH) Interventions    Readmission Risk Interventions No flowsheet data found.

## 2019-05-14 LAB — CBC WITH DIFFERENTIAL/PLATELET
Abs Immature Granulocytes: 0.08 10*3/uL — ABNORMAL HIGH (ref 0.00–0.07)
Basophils Absolute: 0.1 10*3/uL (ref 0.0–0.1)
Basophils Relative: 1 %
Eosinophils Absolute: 0.5 10*3/uL (ref 0.0–0.5)
Eosinophils Relative: 5 %
HCT: 38.7 % — ABNORMAL LOW (ref 39.0–52.0)
Hemoglobin: 13.8 g/dL (ref 13.0–17.0)
Immature Granulocytes: 1 %
Lymphocytes Relative: 26 %
Lymphs Abs: 2.5 10*3/uL (ref 0.7–4.0)
MCH: 27.9 pg (ref 26.0–34.0)
MCHC: 35.7 g/dL (ref 30.0–36.0)
MCV: 78.3 fL — ABNORMAL LOW (ref 80.0–100.0)
Monocytes Absolute: 1 10*3/uL (ref 0.1–1.0)
Monocytes Relative: 10 %
Neutro Abs: 5.4 10*3/uL (ref 1.7–7.7)
Neutrophils Relative %: 57 %
Platelets: 392 10*3/uL (ref 150–400)
RBC: 4.94 MIL/uL (ref 4.22–5.81)
RDW: 13.5 % (ref 11.5–15.5)
WBC: 9.6 10*3/uL (ref 4.0–10.5)
nRBC: 0 % (ref 0.0–0.2)

## 2019-05-14 LAB — COMPREHENSIVE METABOLIC PANEL
ALT: 16 U/L (ref 0–44)
AST: 16 U/L (ref 15–41)
Albumin: 3.4 g/dL — ABNORMAL LOW (ref 3.5–5.0)
Alkaline Phosphatase: 53 U/L (ref 38–126)
Anion gap: 11 (ref 5–15)
BUN: 13 mg/dL (ref 6–20)
CO2: 28 mmol/L (ref 22–32)
Calcium: 9.1 mg/dL (ref 8.9–10.3)
Chloride: 100 mmol/L (ref 98–111)
Creatinine, Ser: 1.01 mg/dL (ref 0.61–1.24)
GFR calc Af Amer: >60 mL/min (ref >60)
GFR calc non Af Amer: >60 mL/min (ref >60)
Glucose, Bld: 101 mg/dL — ABNORMAL HIGH (ref 70–99)
Potassium: 4.1 mmol/L (ref 3.5–5.1)
Sodium: 139 mmol/L (ref 135–145)
Total Bilirubin: 0.5 mg/dL (ref 0.3–1.2)
Total Protein: 6.5 g/dL (ref 6.5–8.1)

## 2019-05-14 LAB — CULTURE, BLOOD (ROUTINE X 2)
Culture: NO GROWTH
Culture: NO GROWTH

## 2019-05-14 LAB — MAGNESIUM: Magnesium: 2.1 mg/dL (ref 1.7–2.4)

## 2019-05-14 LAB — AEROBIC/ANAEROBIC CULTURE W GRAM STAIN (SURGICAL/DEEP WOUND): Culture: NO GROWTH

## 2019-05-14 LAB — PHOSPHORUS: Phosphorus: 4.7 mg/dL — ABNORMAL HIGH (ref 2.5–4.6)

## 2019-05-14 NOTE — Progress Notes (Addendum)
PROGRESS NOTE    David Galloway  IOM:[id number] DOB: August 30, 1975 DOA: 05/08/2019 PCP: Patient, No Pcp Per     Brief Narrative:  44 year old male PMHx Tobacco abuse, Marijuana abuse, Sickle cell trait, Hx Duodenal ulcers.   Presented with left hand edema.   He presented with 10 days of left hand edema, apparently he suffered dog bites in his left hand, and face.  At that time his lacerations were repaired and he received Augmentin for antibiotic therapy.  For the last 48 hours his left hand has become more edematous and tender.  On his initial physical examination his blood pressure was 135/85, heart rate 81, respiratory rate 16, temperature 98.6, oxygen saturation 99%.  His lungs are clear to auscultation bilaterally, heart S1-S2 present rhythmic, his abdomen was soft, no lower extremity edema, his left hand was edematous, tender to palpation.  Sodium 142, potassium 3.1, chloride 108, bicarb 24, glucose 103, BUN 12, creatinine 1.0, white count 9.3, hemoglobin 13.4, hematocrit 37.3, platelets 393.  SARS COVID-19 negative.    Left hand x-ray with moderate soft tissue swelling of the dorsum of the left hand, progressed from prior imaging.  No radiographic evidence of osteomyelitis.  Left hand MRI with subcutaneous abscess over the metacarpals.  Septic tenosynovitis of the compartment 2, 3 and 4 extensors, worse in compartment 4.  Marrow edema and enhancement of the dorsal aspects of the capitate and trapezoid bones most consistent with osteomyelitis.   Subjective: 3/22 A/O x4, negative CP, negative S OB.  Left hand pain and swelling significantly decreased    Assessment & Plan:   Principal Problem:   Cellulitis of left hand Active Problems:   Sickle cell trait (HCC)   Hypokalemia   Tobacco abuse   Marijuana abuse   Duodenal ulcer due to Helicobacter pylori  Left hand cellulitis, tenosynovitis, and capitate/episode bones osteomyelitis -3/17 sp surgical irrigation, tenosynovectomy and bone  biopsy.  -3/20 decreasing inflammation left hand and forearm able to open and close hand.  -Continue leukocytosis however patient understands he will be on long-term IV antibiotics -3/20 per ID Dr. Molly Maduro Comar patient will need to continue IV vancomycin and cefepime End date April 27  -PT recommends no follow-up -3/22 LCSW has arranged for [facility] to provide home antibiotics.  Awaiting ll Monica for teaching [phone] make arrangements to come to hospital to learn use of equipment prior to discharge.  Most likely will not happen until tomorrow  -We will follow up with Dr. Staci Righter ID   Hypokalemia -Resolved  Tobacco abuse -Patient counseled on smoking cessation    Hx PUD secondary to H. pylori. -Protonix 40 mg daily  Marijuana abuse -3/19 urine tox screen positive marijuana -We will counsel patient on need to discontinue  Goals of care -3/19 consult PT; recommends no follow-up -3/19 LCSW consult; patient with serious dog bite which will require long-term antibiotics and follow-up.  Largest concern is patient losing his house secondary to not being able to work. -3/20 LCSW consult; second request for social work to see patient   DVT prophylaxis: SCD Code Status: Full Family Communication:  Disposition Plan: TBD awaiting recommendation from LCSW and advanced home [facility] 1.  Where the patient is from home 2.  Anticipated d/c place. 3.  Barriers to d/c; 3/22 LCSW; working on obtaining home IV antibiotics, and disability    Consultants:  ID Dr. Bennetta Laos Hand surgeon Dr.James J. Creighton, III    Procedures/Significant Events:  3/17 SURGICAL PROCEDURES: Dr.James  Finis Bud, III 1.  Irrigation treatment of left dorsal hand and wrist abscess 2.  Left radical tenosynovectomy of the second, third and fourth dorsal compartments 3.  Left bone biopsy/saucerization of the dorsal trapezoid    I have personally reviewed and interpreted all radiology studies  and my findings are as above.  VENTILATOR SETTINGS:    Cultures 3/17 blood LEFT AC NGTD 3/17 blood RIGHT AC NGTD 3/17 LEFT hand NGTD 3/17 LEFT trapezoid sample positive rare staph aureus     Antimicrobials: Anti-infectives (From admission, onward)   Start     Dose/Rate Stop   05/10/19 0300  vancomycin (VANCOREADY) IVPB 750 mg/150 mL     750 mg 150 mL/hr over 60 Minutes     05/09/19 1330  ceFEPIme (MAXIPIME) 2 g in sodium chloride 0.9 % 100 mL IVPB     2 g 200 mL/hr over 30 Minutes     05/09/19 1315  vancomycin (VANCOREADY) IVPB 1500 mg/300 mL     1,500 mg 150 mL/hr over 120 Minutes 05/09/19 1814   05/09/19 0800  cefTRIAXone (ROCEPHIN) 1 g in sodium chloride 0.9 % 100 mL IVPB  Status:  Discontinued     1 g 200 mL/hr over 30 Minutes 05/09/19 0717   05/09/19 0800  Ampicillin-Sulbactam (UNASYN) 3 g in sodium chloride 0.9 % 100 mL IVPB     3 g 200 mL/hr over 30 Minutes 05/09/19 0943       Devices    LINES / TUBES:  PICC RIGHT Brachial 3/20>>>    Continuous Infusions: .  ceFAZolin (ANCEF) IV 2 g (05/14/19 1335)     Objective: Vitals:   05/13/19 1513 05/13/19 2115 05/14/19 0508 05/14/19 1430  BP: 123/71 121/77 121/83 111/76  Pulse: 63 79 61 72  Resp: 16 17 16 18   Temp: 98.6 F (37 C) 98.5 F (36.9 C) 98.5 F (36.9 C) 98.4 F (36.9 C)  TempSrc: Oral Oral Oral Oral  SpO2: 100% 97% 100% 100%  Weight:      Height:        Intake/Output Summary (Last 24 hours) at 05/14/2019 1922 Last data filed at 05/14/2019 1600 Gross per 24 hour  Intake 780 ml  Output --  Net 780 ml   Filed Weights   05/09/19 1610 05/09/19 2026  Weight: 65.8 kg 65.8 kg   Physical Exam:  General: A/O x4, no acute respiratory distress Eyes: negative scleral hemorrhage, negative anisocoria, negative icterus ENT: Negative Runny nose, negative gingival bleeding, Neck:  Negative scars, masses, torticollis, lymphadenopathy, JVD Lungs: Clear to auscultation bilaterally without wheezes or  crackles Cardiovascular: Regular rate and rhythm without murmur gallop or rub normal S1 and S2 Abdomen: negative abdominal pain, nondistended, positive soft, bowel sounds, no rebound, no ascites, no appreciable mass Extremities: Left Hand and Forearm Swollen.  Able to make a fist with his hand. Skin: Negative rashes, lesions, ulcers Psychiatric:  Negative depression, negative anxiety, negative fatigue, negative mania  Central nervous system:  Cranial nerves II through XII intact, tongue/uvula midline, all extremities muscle strength 5/5, sensation intact throughout, negative dysarthria, negative expressive aphasia, negative receptive aphasia.   .     Data Reviewed: Care during the described time interval was provided by me .  I have reviewed this patient's available data, including medical history, events of note, physical examination, and all test results as part of my evaluation.  CBC: Recent Labs  Lab 05/08/19 2337 05/10/19 0219 05/12/19 0242 05/13/19 0222 05/14/19 0441  WBC 9.3 11.6* 9.4  9.9 9.6  NEUTROABS 5.6  --  5.2 5.8 5.4  HGB 13.4 12.3* 13.1 12.7* 13.8  HCT 37.3* 34.8* 37.3* 35.4* 38.7*  MCV 79.5* 79.5* 78.9* 79.0* 78.3*  PLT 393 374 373 366 392   Basic Metabolic Panel: Recent Labs  Lab 05/08/19 2337 05/09/19 1444 05/10/19 0219 05/12/19 0242 05/13/19 0222 05/14/19 0441  NA 142  --  139 141 140 139  K 3.1*  --  4.3 3.6 3.9 4.1  CL 108  --  105 103 104 100  CO2 24  --  23 27 27 28   GLUCOSE 103*  --  134* 113* 118* 101*  BUN 12  --  9 11 13 13   CREATININE 1.05  --  1.01 1.03 1.00 1.01  CALCIUM 8.6*  --  8.4* 8.8* 9.0 9.1  MG  --  2.0  --  2.1 2.0 2.1  PHOS  --   --   --  4.9* 4.3 4.7*   GFR: Estimated Creatinine Clearance: 86 mL/min (by C-G formula based on SCr of 1.01 mg/dL). Liver Function Tests: Recent Labs  Lab 05/08/19 2337 05/10/19 0219 05/12/19 0242 05/13/19 0222 05/14/19 0441  AST 17 12* 13* 13* 16  ALT 16 15 13 14 16   ALKPHOS 54 47 53 48  53  BILITOT 0.4 0.5 0.5 0.5 0.5  PROT 6.7 5.8* 5.9* 6.2* 6.5  ALBUMIN 3.4* 2.9* 3.1* 3.2* 3.4*   No results for input(s): LIPASE, AMYLASE in the last 168 hours. No results for input(s): AMMONIA in the last 168 hours. Coagulation Profile: No results for input(s): INR, PROTIME in the last 168 hours. Cardiac Enzymes: No results for input(s): CKTOTAL, CKMB, CKMBINDEX, TROPONINI in the last 168 hours. BNP (last 3 results) No results for input(s): PROBNP in the last 8760 hours. HbA1C: No results for input(s): HGBA1C in the last 72 hours. CBG: No results for input(s): GLUCAP in the last 168 hours. Lipid Profile: No results for input(s): CHOL, HDL, LDLCALC, TRIG, CHOLHDL, LDLDIRECT in the last 72 hours. Thyroid Function Tests: No results for input(s): TSH, T4TOTAL, FREET4, T3FREE, THYROIDAB in the last 72 hours. Anemia Panel: No results for input(s): VITAMINB12, FOLATE, FERRITIN, TIBC, IRON, RETICCTPCT in the last 72 hours. Sepsis Labs: Recent Labs  Lab 05/08/19 2359 05/09/19 0640  LATICACIDVEN 0.9 0.9    Recent Results (from the past 240 hour(s))  Respiratory Panel by RT PCR (Flu A&B, Covid) - Nasopharyngeal Swab     Status: None   Collection Time: 05/09/19 12:49 PM   Specimen: Nasopharyngeal Swab  Result Value Ref Range Status   SARS Coronavirus 2 by RT PCR NEGATIVE NEGATIVE Final    Comment: (NOTE) SARS-CoV-2 target nucleic acids are NOT DETECTED. The SARS-CoV-2 RNA is generally detectable in upper respiratoy specimens during the acute phase of infection. The lowest concentration of SARS-CoV-2 viral copies this assay can detect is 131 copies/mL. A negative result does not preclude SARS-Cov-2 infection and should not be used as the sole basis for treatment or other patient management decisions. A negative result may occur with  improper specimen collection/handling, submission of specimen other than nasopharyngeal swab, presence of viral mutation(s) within the areas targeted  by this assay, and inadequate number of viral copies (<131 copies/mL). A negative result must be combined with clinical observations, patient history, and epidemiological information. The expected result is Negative. Fact Sheet for Patients:  05/10/19 Fact Sheet for Healthcare Providers:  05/11/19 This test is not yet ap proved or cleared by the 05/11/19  FDA and  has been authorized for detection and/or diagnosis of SARS-CoV-2 by FDA under an Emergency Use Authorization (EUA). This EUA will remain  in effect (meaning this test can be used) for the duration of the COVID-19 declaration under Section 564(b)(1) of the Act, 21 U.S.C. section 360bbb-3(b)(1), unless the authorization is terminated or revoked sooner.    Influenza A by PCR NEGATIVE NEGATIVE Final   Influenza B by PCR NEGATIVE NEGATIVE Final    Comment: (NOTE) The Xpert Xpress SARS-CoV-2/FLU/RSV assay is intended as an aid in  the diagnosis of influenza from Nasopharyngeal swab specimens and  should not be used as a sole basis for treatment. Nasal washings and  aspirates are unacceptable for Xpert Xpress SARS-CoV-2/FLU/RSV  testing. Fact Sheet for Patients: https://www.[employer].com/https://www.fda.gov/media/142436/download Fact Sheet for Healthcare Providers: https://www.young.biz/https://www.fda.gov/media/142435/download This test is not yet approved or cleared by the [place] FDA and  has been authorized for detection and/or diagnosis of SARS-CoV-2 by  FDA under an Emergency Use Authorization (EUA). This EUA will remain  in effect (meaning this test can be used) for the duration of the  Covid-19 declaration under Section 564(b)(1) of the Act, 21  U.S.C. section 360bbb-3(b)(1), unless the authorization is  terminated or revoked. Performed at [facility], [address] [place], [place] [address]   Culture, blood (Routine X 2) w Reflex to ID Panel     Status: None   Collection Time:  05/09/19  2:33 PM   Specimen: BLOOD  Result Value Ref Range Status   Specimen Description BLOOD LEFT ANTECUBITAL  Final   Special Requests   Final    BOTTLES DRAWN AEROBIC AND ANAEROBIC Blood Culture results may not be optimal due to an excessive volume of blood received in culture bottles   Culture   Final    NO GROWTH 5 DAYS Performed at [facility], [address] [place], [place] [address]    Report Status 05/14/2019 FINAL  Final  Culture, blood (Routine X 2) w Reflex to ID Panel     Status: None   Collection Time: 05/09/19  2:44 PM   Specimen: BLOOD  Result Value Ref Range Status   Specimen Description BLOOD RIGHT ANTECUBITAL  Final   Special Requests   Final    BOTTLES DRAWN AEROBIC AND ANAEROBIC Blood Culture results may not be optimal due to an inadequate volume of blood received in culture bottles   Culture   Final    NO GROWTH 5 DAYS Performed at [facility], [address] [place], [place] [address]    Report Status 05/14/2019 FINAL  Final  Surgical PCR screen     Status: None   Collection Time: 05/09/19  3:50 PM   Specimen: Nasal Mucosa; Nasal Swab  Result Value Ref Range Status   MRSA, PCR NEGATIVE NEGATIVE Final   Staphylococcus aureus NEGATIVE NEGATIVE Final    Comment: (NOTE) The Xpert SA Assay (FDA approved for NASAL specimens in patients 46 years of age and older), is one component of a comprehensive surveillance program. It is not intended to diagnose infection nor to guide or monitor treatment. Performed at [facility], [address] [place], [place] [address]   Aerobic/Anaerobic Culture (surgical/deep wound)     Status: None   Collection Time: 05/09/19  5:21 PM   Specimen: Wound  Result Value Ref Range Status   Specimen Description HAND  Final   Special Requests LEFT SAMPLE A  Final   Gram  Stain   Final    RARE WBC PRESENT, PREDOMINANTLY PMN NO ORGANISMS SEEN    Culture   Final    No growth aerobically or  anaerobically. Performed at [facility], [address], [place], [place] [address]    Report Status 05/14/2019 FINAL  Final  Aerobic/Anaerobic Culture (surgical/deep wound)     Status: None   Collection Time: 05/09/19  5:45 PM   Specimen: Wound  Result Value Ref Range Status   Specimen Description WOUND  Final   Special Requests LEFT TRAPEZOID SAMPLE B  Final   Gram Stain   Final    RARE WBC PRESENT, PREDOMINANTLY PMN NO ORGANISMS SEEN    Culture   Final    RARE STAPHYLOCOCCUS AUREUS NO ANAEROBES ISOLATED Performed at [facility], [address], [place], [place] [address]    Report Status 05/14/2019 FINAL  Final   Organism ID, Bacteria STAPHYLOCOCCUS AUREUS  Final      Susceptibility   Staphylococcus aureus - MIC*    CIPROFLOXACIN <=0.5 SENSITIVE Sensitive     ERYTHROMYCIN <=0.25 SENSITIVE Sensitive     GENTAMICIN <=0.5 SENSITIVE Sensitive     OXACILLIN <=0.25 SENSITIVE Sensitive     TETRACYCLINE <=1 SENSITIVE Sensitive     VANCOMYCIN <=0.5 SENSITIVE Sensitive     TRIMETH/SULFA <=10 SENSITIVE Sensitive     CLINDAMYCIN <=0.25 SENSITIVE Sensitive     RIFAMPIN <=0.5 SENSITIVE Sensitive     Inducible Clindamycin NEGATIVE Sensitive     * RARE STAPHYLOCOCCUS AUREUS         Radiology Studies: No results found.      Scheduled Meds: . Chlorhexidine Gluconate Cloth  6 each Topical Daily  . pantoprazole  40 mg Oral Daily   Continuous Infusions: .  ceFAZolin (ANCEF) IV 2 g (05/14/19 1335)     LOS: 5 days    Time spent:40 min    Cher Egnor, Roselind Messier, MD [facility] Pager [phone]  If 7PM-7AM, please contact night-coverage [email or web address] Password [facility] 05/14/2019, 7:22 PM

## 2019-05-14 NOTE — Progress Notes (Signed)
Occupational Therapy Treatment Patient Details Name: David Galloway MRN: [id number] DOB: April 09, 1975 Today's Date: 05/14/2019    History of present illness Pt is a 44 y/o male with PMH of tobacco abuse, marijuana abuse, sickle cell trait, duodenal ulcer due to  Helicobacter pylori presents to emergency department due to left hand swelling for 10 days (reporting being attacked by a dog). S/P I&D L dorsal hand and wrist abscess, L radical tenosynovectomy of 2nd, 3rd and 4th dorsal compartments, L bone biopsy of dorsal trapezoid. Per ortho early digit and wrist ROM as tolerated.    OT comments  Patient agreeable to OT.  Patient progressing with L hand ROM, demonstrating ability to complete minimal wrist flexion/extension, 5th digit extension; but remains limited by decreased functional extension of digits 2-4.  Reviewed exercises for AAROM of wrist/finger extension, tightness noted with MCP flexion; completed PROM of MCPs, AAROM of MCPs on tabletop, progressing to gravity eliminated MCP movement.  Pt reports completing exercises throughout the day, using L hand for all functional tasks (reports "using it more than my R hand"). Will follow acutely. Continue to recommend OP OT.    Follow Up Recommendations  Follow surgeon's recommendation for DC plan and follow-up therapies;Outpatient OT    Equipment Recommendations  None recommended by OT    Recommendations for Other Services      Precautions / Restrictions Precautions Precautions: None Restrictions Weight Bearing Restrictions: No       Mobility Bed Mobility                  Transfers Overall transfer level: Independent                    Balance Overall balance assessment: Independent                                         ADL either performed or assessed with clinical judgement   ADL Overall ADL's : Modified independent                                     Functional mobility  during ADLs: Modified independent General ADL Comments: session focused on L UE exercises      Vision       Perception     Praxis      Cognition Arousal/Alertness: Awake/alert Behavior During Therapy: WFL for tasks assessed/performed Overall Cognitive Status: Within Functional Limits for tasks assessed                                          Exercises Exercises: Hand exercises;Other exercises Hand Exercises Forearm Supination: AROM;Left;10 reps;Seated Forearm Pronation: AROM;Left;10 reps;Seated Wrist Flexion: AROM;Seated;10 reps Wrist Extension: AROM;Self ROM;10 reps;Seated Digit Composite Flexion: AROM;Left;10 reps;Seated Composite Extension: 10 reps;Left;AAROM;Seated Digit Composite Abduction: AROM;Left;10 reps;Seated Digit Composite Adduction: AROM;Right;10 reps;Seated Thumb Abduction: AROM;Left;10 reps;Seated Thumb Adduction: AROM;Left;10 reps;Seated Opposition: AROM;Seated;Left;10 reps Other Exercises Other Exercises: L hand MCP flexion with table top assist (and mild wrist extension) x 10 reps  Other Exercises: L hand MCP flexion/extension x 10 reps    Shoulder Instructions       General Comments educated on MCP and finger flexion/extension in gravity eliminated positioning     Pertinent  Vitals/ Pain       Pain Assessment: No/denies pain  Home Living                                          Prior Functioning/Environment              Frequency  Min 3X/week        Progress Toward Goals  OT Goals(current goals can now be found in the care plan section)  Progress towards OT goals: Progressing toward goals  Acute Rehab OT Goals Patient Stated Goal: to get my L hand feeling better OT Goal Formulation: With patient  Plan Discharge plan remains appropriate;Frequency remains appropriate    Co-evaluation                 AM-PAC OT "6 Clicks" Daily Activity     Outcome Measure   Help from another person  eating meals?: None Help from another person taking care of personal grooming?: None Help from another person toileting, which includes using toliet, bedpan, or urinal?: None Help from another person bathing (including washing, rinsing, drying)?: None Help from another person to put on and taking off regular upper body clothing?: None Help from another person to put on and taking off regular lower body clothing?: None 6 Click Score: 24    End of Session    OT Visit Diagnosis: Pain;Muscle weakness (generalized) (M62.81) Pain - Right/Left: Left Pain - part of body: Arm;Hand   Activity Tolerance Patient tolerated treatment well   Patient Left Other (comment);with call bell/phone within reach(EOB )   Nurse Communication          Time: 9030-0923 OT Time Calculation (min): 14 min  Charges: OT General Charges $OT Visit: 1 Visit OT Treatments $Therapeutic Exercise: 8-22 mins  Barry Brunner, OT Acute Rehabilitation Services Pager [phone] Office [phone]    Chancy Milroy 05/14/2019, 12:39 PM

## 2019-05-14 NOTE — Plan of Care (Signed)
  Problem: Education: Goal: Knowledge of General Education information will improve Description Including pain rating scale, medication(s)/side effects and non-pharmacologic comfort measures Outcome: Progressing   

## 2019-05-14 NOTE — TOC Progression Note (Addendum)
Transition of Care [facility]) - Progression Note    Patient Details  Name: David Galloway MRN: [id number] Date of Birth: 20-Oct-1975  Transition of Care [facility]) CM/SW Contact  David Rubins [name], RN Phone Number: 05/14/2019, 12:28 PM  Clinical Narrative:     See prior notes.   David Galloway with [name] unable to accept patient due to staffing.   David Galloway with [facility] checking to see if they can accept patient and when start of care would be.   Spoke to patient. He has a friend David Galloway who would be willing to be taught how to change dressing and adm IV ABX. NCM asked patient if he would call David Galloway to confirm and if she is available to come to hospital for teaching. Patient stated he will. Patient has NCM direct number, he or David Galloway will call back.   Will need home health orders with wound care instructions. Called Dr Cain Saupe office spoke with David Galloway.   Patient does not have PCP. Will arrange follow up appointment at [facility], however, [facility] will not see patient until he has established care with patient. Asked if Dr Cain Saupe would be willing to provide [facility] orders with wound care instructions. Awaiting call back Spoke with David Galloway, she will enter wound care instructions.    1334 Update David Galloway with [facility] has accepted referral. Bedside nurse and David Galloway with Infusion will need to provide teaching to patient's friend David Galloway. Will message MD. David Galloway's phone number [phone] Expected Discharge Plan: Home w Home Health Services Barriers to Discharge: Inadequate or no insurance(trying to arrange home health)  Expected Discharge Plan and Services Expected Discharge Plan: Home w Home Health Services In-house Referral: Financial Counselor Discharge Planning Services: CM Consult Post Acute Care Choice: Home Health Living arrangements for the past 2 months: Single Family Home                 DME Arranged: N/A DME Agency: NA       HH Arranged: RN HH Agency: [facility] Date HH Agency Contacted:  05/10/19 Time HH Agency Contacted: 1245 Representative spoke with at [facility] Agency: Spoke to [place] with [facility] awaiting call back   Social Determinants of Health (SDOH) Interventions    Readmission Risk Interventions No flowsheet data found.

## 2019-05-15 LAB — COMPREHENSIVE METABOLIC PANEL
ALT: 16 U/L (ref 0–44)
AST: 17 U/L (ref 15–41)
Albumin: 3.5 g/dL (ref 3.5–5.0)
Alkaline Phosphatase: 58 U/L (ref 38–126)
Anion gap: 9 (ref 5–15)
BUN: 13 mg/dL (ref 6–20)
CO2: 29 mmol/L (ref 22–32)
Calcium: 9 mg/dL (ref 8.9–10.3)
Chloride: 103 mmol/L (ref 98–111)
Creatinine, Ser: 1.2 mg/dL (ref 0.61–1.24)
GFR calc Af Amer: >60 mL/min (ref >60)
GFR calc non Af Amer: >60 mL/min (ref >60)
Glucose, Bld: 119 mg/dL — ABNORMAL HIGH (ref 70–99)
Potassium: 4.1 mmol/L (ref 3.5–5.1)
Sodium: 141 mmol/L (ref 135–145)
Total Bilirubin: 0.4 mg/dL (ref 0.3–1.2)
Total Protein: 6.5 g/dL (ref 6.5–8.1)

## 2019-05-15 LAB — CBC WITH DIFFERENTIAL/PLATELET
Abs Immature Granulocytes: 0.12 10*3/uL — ABNORMAL HIGH (ref 0.00–0.07)
Basophils Absolute: 0.1 10*3/uL (ref 0.0–0.1)
Basophils Relative: 1 %
Eosinophils Absolute: 0.5 10*3/uL (ref 0.0–0.5)
Eosinophils Relative: 5 %
HCT: 39.6 % (ref 39.0–52.0)
Hemoglobin: 14 g/dL (ref 13.0–17.0)
Immature Granulocytes: 1 %
Lymphocytes Relative: 24 %
Lymphs Abs: 2.5 10*3/uL (ref 0.7–4.0)
MCH: 28.1 pg (ref 26.0–34.0)
MCHC: 35.4 g/dL (ref 30.0–36.0)
MCV: 79.4 fL — ABNORMAL LOW (ref 80.0–100.0)
Monocytes Absolute: 1.1 10*3/uL — ABNORMAL HIGH (ref 0.1–1.0)
Monocytes Relative: 11 %
Neutro Abs: 6.1 10*3/uL (ref 1.7–7.7)
Neutrophils Relative %: 58 %
Platelets: 390 10*3/uL (ref 150–400)
RBC: 4.99 MIL/uL (ref 4.22–5.81)
RDW: 13.5 % (ref 11.5–15.5)
WBC: 10.5 10*3/uL (ref 4.0–10.5)
nRBC: 0 % (ref 0.0–0.2)

## 2019-05-15 LAB — PHOSPHORUS: Phosphorus: 4.8 mg/dL — ABNORMAL HIGH (ref 2.5–4.6)

## 2019-05-15 LAB — MAGNESIUM: Magnesium: 2.1 mg/dL (ref 1.7–2.4)

## 2019-05-15 MED ORDER — CEFAZOLIN IV (FOR PTA / DISCHARGE USE ONLY): 2.0000 g | Freq: Three times a day (TID) | INTRAVENOUS | 0 refills | Status: AC

## 2019-05-15 MED ORDER — ONDANSETRON HCL 4 MG PO TABS: 4.0000 mg | ORAL_TABLET | Freq: Four times a day (QID) | ORAL | 0 refills | Status: AC | PRN

## 2019-05-15 MED ORDER — ACETAMINOPHEN 325 MG PO TABS: 650.0000 mg | ORAL_TABLET | Freq: Four times a day (QID) | ORAL | 0 refills | Status: AC | PRN

## 2019-05-15 NOTE — TOC Progression Note (Signed)
Transition of Care [facility]) - Progression Note    Patient Details  Name: David Galloway MRN: [id number] Date of Birth: 1975-10-14  Transition of Care [facility]) CM/SW Contact  Nadene Rubins [name], RN Phone Number: 05/15/2019, 12:18 PM  Clinical Narrative:     Maxine Glenn has done teaching IV ABX with Pam from [facility]. Patient will receive 2pm dose here in hospital prior to discharge to home. MD will sign OPAT script and order [facility]   Expected Discharge Plan: Home w Home Health Services Barriers to Discharge: No Barriers Identified  Expected Discharge Plan and Services Expected Discharge Plan: Home w Home Health Services In-house Referral: Financial Counselor Discharge Planning Services: CM Consult Post Acute Care Choice: Home Health Living arrangements for the past 2 months: Single Family Home                 DME Arranged: N/A DME Agency: NA       HH Arranged: RN HH Agency: [facility] Date HH Agency Contacted: 05/15/19 Time HH Agency Contacted: 1218 Representative spoke with at [facility] Agency: Lupita Leash   Social Determinants of Health (SDOH) Interventions    Readmission Risk Interventions No flowsheet data found.

## 2019-05-15 NOTE — Progress Notes (Signed)
Occupational Therapy Treatment Patient Details Name: David Galloway MRN: [id number] DOB: 1976/02/13 Today's Date: 05/15/2019    History of present illness Pt is a 44 y/o male with PMH of tobacco abuse, marijuana abuse, sickle cell trait, duodenal ulcer due to  Helicobacter pylori presents to emergency department due to left hand swelling for 10 days (reporting being attacked by a dog). S/P I&D L dorsal hand and wrist abscess, L radical tenosynovectomy of 2nd, 3rd and 4th dorsal compartments, L bone biopsy of dorsal trapezoid. Per ortho early digit and wrist ROM as tolerated.    OT comments  Patient seen for L UE AROM progression.  Continues to be limited by edema, pain and weakness.  Noted difficulty with MCP flexion, wrist extension, and digits 2-4 extension. Reviewed importance of passive stretch, progression of exercises.  Provided theraputty and reviewed exercises, reviewed tendon glide exercises.  Continue to recommend OP OT services for L hand. Discussed case with CM, and pt plans to get therapy at orthopedic office. Will follow.    Follow Up Recommendations  Follow surgeon's recommendation for DC plan and follow-up therapies;Outpatient OT    Equipment Recommendations  None recommended by OT    Recommendations for Other Services      Precautions / Restrictions Precautions Precautions: None Restrictions Weight Bearing Restrictions: No       Mobility Bed Mobility Overal bed mobility: Independent                Transfers                      Balance Overall balance assessment: Independent                                         ADL either performed or assessed with clinical judgement   ADL                                         General ADL Comments: session focused on L UE exercises      Vision       Perception     Praxis      Cognition Arousal/Alertness: Awake/alert Behavior During Therapy: WFL for tasks  assessed/performed Overall Cognitive Status: Within Functional Limits for tasks assessed                                          Exercises Exercises: Hand exercises;Other exercises Hand Exercises Wrist Flexion: AROM;Seated;10 reps Wrist Extension: AROM;Self ROM;10 reps;Seated Digit Composite Flexion: AROM;Left;10 reps;Seated Composite Extension: 10 reps;Left;AAROM;Seated Digit Composite Abduction: AROM;Left;10 reps;Seated Digit Composite Adduction: AROM;Right;10 reps;Seated Digit Lifts: AROM;Left;10 reps;Seated(can only lift thumb and pinky) Thumb Abduction: AROM;Left;10 reps;Seated Thumb Adduction: AROM;Left;10 reps;Seated Other Exercises Other Exercises: L hand tendon glides Other Exercises: L hand theraputty exercises on table top (pinch, pull, rolling, ab/aduction)   Shoulder Instructions       General Comments provided further exercises for tendon glides and theraputty for L hand; educated on importance of stretching through extensors to prevent tightness during healing     Pertinent Vitals/ Pain       Pain Assessment: Faces Faces Pain Scale: Hurts a little bit Pain Location: L wrist with  exercises  Pain Descriptors / Indicators: Discomfort;Sore;Operative site guarding Pain Intervention(s): Monitored during session;Repositioned  Home Living                                          Prior Functioning/Environment              Frequency  Min 3X/week        Progress Toward Goals  OT Goals(current goals can now be found in the care plan section)  Progress towards OT goals: Progressing toward goals  Acute Rehab OT Goals Patient Stated Goal: to get my L hand feeling better OT Goal Formulation: With patient  Plan Discharge plan remains appropriate;Frequency remains appropriate    Co-evaluation                 AM-PAC OT "6 Clicks" Daily Activity     Outcome Measure   Help from another person eating meals?:  None Help from another person taking care of personal grooming?: None Help from another person toileting, which includes using toliet, bedpan, or urinal?: None Help from another person bathing (including washing, rinsing, drying)?: None Help from another person to put on and taking off regular upper body clothing?: None Help from another person to put on and taking off regular lower body clothing?: None 6 Click Score: 24    End of Session    OT Visit Diagnosis: Pain;Muscle weakness (generalized) (M62.81) Pain - Right/Left: Left Pain - part of body: Arm;Hand   Activity Tolerance Patient tolerated treatment well   Patient Left in bed   Nurse Communication          Time: 0017-4944 OT Time Calculation (min): 27 min  Charges: OT General Charges $OT Visit: 1 Visit OT Treatments $Therapeutic Exercise: 23-37 mins  Jolaine Artist, OT Acute Rehabilitation Services Pager [phone] Office [phone]    David Galloway 05/15/2019, 3:37 PM

## 2019-05-15 NOTE — Discharge Summary (Signed)
Physician Discharge Summary  David Galloway EXH:[id number] DOB: [birth year] DOA: 05/08/2019  PCP: David Galloway, No Pcp Per  Admit date: 05/08/2019 Discharge date: 05/15/2019  Time spent: 4mnutes  Recommendations for Outpatient Follow-up:   Left hand cellulitis, tenosynovitis, and capitate/episode bones osteomyelitis -3/17 sp surgical irrigation, tenosynovectomy and bone biopsy.  -3/20 decreasing inflammation left hand and forearm able to open and close hand.  -Continue leukocytosis however David Galloway understands he will be on long-term IV antibiotics -3/20 per ID Dr. RHerbie Galloway David Galloway will need to continue IV vancomycin and cefepime End date April 27  -PT recommends no follow-up -3/22 LCSW has arranged for [place] provide home antibiotics.  [place] [phone] arrangements to come to hospital to learn use of equipment prior to discharge.  Most likely will not happen until tomorrow  -Follow-up with David Galloway ID 4 April @ 1100 for osteomyelitis -Post surgery follow-[facility] appointment [facility] April @ 0930  Hypokalemia -Resolved  Tobacco abuse -David Galloway counseled on smoking cessation    Hx PUD secondary to H. pylori. -Protonix 40 mg daily  Marijuana abuse -3/19 urine tox screen positive marijuana -We will counsel David Galloway on need to discontinue   Discharge Diagnoses:  Principal Problem:   Cellulitis of left hand Active Problems:   Sickle cell trait (HCC)   Hypokalemia   Tobacco abuse   Marijuana abuse   Duodenal ulcer due to Helicobacter pylori   Discharge Condition: Stable  Diet recommendation: Regular  Filed Weights   05/09/19 0637 05/09/19 2026  Weight: 65.8 kg 65.8 kg    History of present illness:  44year old male PMHx Tobacco abuse, Marijuana abuse, Sickle cell trait, Hx Duodenal ulcers.   Presented with left hand edema. He presented with 10 days of left hand edema, apparently  he suffered dog bites in his left hand, and face. At that time his lacerations were repaired and he received Augmentin for antibiotic therapy. For the last 48 hours his left hand has become more edematous and tender. On his initial physical examination his blood pressure was 135/85, heart rate 81, respiratory rate 16, temperature 98.6, oxygen saturation 99%. His lungs are clear to auscultation bilaterally, heart S1-S2 present rhythmic, his abdomen was soft, no lower extremity edema, his left hand was edematous, tender to palpation. Sodium 142, potassium 3.1, chloride 108, bicarb 24, glucose 103, BUN 12, creatinine 1.0, white count 9.3, hemoglobin 13.4, hematocrit 37.3, platelets 393.SARS COVID-19 negative.   Left hand x-ray with moderate soft tissue swelling of the dorsum of the left hand, progressed from prior imaging. No radiographic evidence of osteomyelitis. Left hand MRI with subcutaneous abscess over the metacarpals. Septic tenosynovitis of the compartment 2, 3 and 4 extensors, worse in compartment 4. Marrow edema and enhancement of the dorsal aspects of the capitate and trapezoid bones most consistent with osteomyelitis.  Hospital Course:  David Galloway had surgical intervention and has been placed on IV antibiotics has done well. Understands will be on IV antibiotics for significant amount of time. We will then have to follow-up with ID.  Procedures: 3/17 SURGICAL PROCEDURES: DavidJames J. Galloway, III 1. Irrigation treatment of left dorsal hand and wrist abscess 2. Left radical tenosynovectomy of the second, third and fourth dorsal compartments 3. Left bone biopsy/saucerization of the dorsal trapezoid  Consultations: ID Dr. RMartyn EhrichHand surgeon DavidJames J. Galloway, III  Cultures   3/17 LEFT trapezoid positive MSSA 3/17 blood RIGHT AC negative final 3/17 LEFT hand  negative final 3/17 MRSA by PCR negative   Antibiotics Anti-infectives (From admission, onward)    Start     Dose/Rate Stop   05/13/19 1400  ceFAZolin (ANCEF) IVPB 2g/100 mL premix     2 g 200 mL/hr over 30 Minutes 06/18/20 2359   05/10/19 0300  vancomycin (VANCOREADY) IVPB 750 mg/150 mL  Status:  Discontinued     750 mg 150 mL/hr over 60 Minutes 05/13/19 1034   05/09/19 1330  ceFEPIme (MAXIPIME) 2 g in sodium chloride 0.9 % 100 mL IVPB  Status:  Discontinued     2 g 200 mL/hr over 30 Minutes 05/13/19 1034   05/09/19 1315  vancomycin (VANCOREADY) IVPB 1500 mg/300 mL     1,500 mg 150 mL/hr over 120 Minutes 05/09/19 1814   05/09/19 0800  cefTRIAXone (ROCEPHIN) 1 g in sodium chloride 0.9 % 100 mL IVPB  Status:  Discontinued     1 g 200 mL/hr over 30 Minutes 05/09/19 0717   05/09/19 0800  Ampicillin-Sulbactam (UNASYN) 3 g in sodium chloride 0.9 % 100 mL IVPB     3 g 200 mL/hr over 30 Minutes 05/09/19 0943       Discharge Exam: Vitals:   05/14/19 1430 05/14/19 2014 05/15/19 0521 05/15/19 1255  BP: 111/76 113/72 114/77 104/68  Pulse: 72 72 81 96  Resp: 18 17 17 20   Temp: 98.4 F (36.9 C) 98.5 F (36.9 C) 98 F (36.7 C) 98.1 F (36.7 C)  TempSrc: Oral Oral Oral Oral  SpO2: 100% 99% 97% 100%  Weight:      Height:        General: A/O x4, no acute respiratory distress Eyes: negative scleral hemorrhage, negative anisocoria, negative icterus ENT: Negative Runny nose, negative gingival bleeding, Neck:  Negative scars, masses, torticollis, lymphadenopathy, JVD Lungs: Clear to auscultation bilaterally without wheezes or crackles Cardiovascular: Regular rate and rhythm without murmur gallop or rub normal S1 and S2 Abdomen: negative abdominal pain, nondistended, positive soft, bowel sounds, no rebound, no ascites, no appreciable mass Extremities: LEFT hand and forearm swollen warm to the touch.  Did not take down dressing   Discharge Instructions  Discharge Instructions    Home infusion instructions   Complete by: As directed    Instructions: Flushing of vascular access  device: 0.9% NaCl pre/post medication administration and prn patency; Heparin 100 u/ml, 76m for implanted ports and Heparin 10u/ml, 594mfor all other central venous catheters.     Allergies as of 05/15/2019   No Known Allergies     Medication List    STOP taking these medications   AMBULATORY NON FORMULARY MEDICATION   amoxicillin-clavulanate 875-125 MG tablet Commonly known as: Augmentin     TAKE these medications   acetaminophen 325 MG tablet Commonly known as: TYLENOL Take 2 tablets (650 mg total) by mouth every 6 (six) hours as needed for mild pain (or Fever >/= 101).   ceFAZolin  IVPB Commonly known as: ANCEF Inject 2 g into the vein every 8 (eight) hours. Indication:  osteomyelitis Last Day of Therapy:  06/19/2019 Labs - Once weekly:  CBC/D and BMP, Labs - Every other week:  ESR and CRP   HYDROcodone-acetaminophen 5-325 MG tablet Commonly known as: Norco Take 1 tablet by mouth every 6 (six) hours as needed.   ibuprofen 600 MG tablet Commonly known as: ADVIL Take 1 tablet (600 mg total) by mouth every 6 (six) hours as needed.   ondansetron 4 MG tablet Commonly known as: ZOFRAN Take  1 tablet (4 mg total) by mouth every 6 (six) hours as needed for nausea.            Home Infusion Instuctions  (From admission, onward)         Start     Ordered   05/15/19 0000  Home infusion instructions    Question:  Instructions  Answer:  Flushing of vascular access device: 0.9% NaCl pre/post medication administration and prn patency; Heparin 100 u/ml, 86m for implanted ports and Heparin 10u/ml, 567mfor all other central venous catheters.   05/15/19 1517         No Known Allergies Follow-up Information    [facility]. Schedule an appointment as soon as possible for a visit.   Contact information: [id number]   [facility]     CoRevereRoOkey RegalMD. Schedule an appointment as soon as possible for a visit on 05/31/2019.   Specialty: Infectious Diseases Why: F/U appt scheduled for 11 am on May 31, 2019 Contact information: [phone]        CrVerner MouldMD.   Why: F/U 2 weeks post op appt on  May 30, 2019 @ 9:30 am Contact information: [address] [place] [phone]          The results of significant diagnostics from this hospitalization (including imaging, microbiology, ancillary and laboratory) are listed below for reference.    Significant Diagnostic Studies: DG Hand 2 View Right  Result Date: 04/29/2019 CLINICAL DATA:  Dog bites. EXAM: RIGHT HAND - 2 VIEW COMPARISON:  None. FINDINGS: There is soft tissue swelling and subcutaneous gas along the ulnar aspect of the hand surrounding the fifth and fourth metacarpals. There is dorsal soft tissue swelling about the hand. There is no acute displaced fracture or dislocation. No radiopaque foreign body. IMPRESSION: 1. No acute displaced fracture or dislocation. 2. No radiopaque foreign body. 3. Soft tissue swelling and subcutaneous gas is noted as above. Electronically Signed   By: ChConstance Holster.D.   On: 04/29/2019 20:09   DG Hand 2 View Left  Result Date: 04/29/2019 CLINICAL DATA:  Dog bite to left hand. Left hand pain and lacerations. Initial encounter. EXAM: LEFT HAND - 2 VIEW COMPARISON:  07/10/2009 FINDINGS: Soft tissue swelling and subcutaneous emphysema is seen along the dorsal aspect of the metacarpals. No radiopaque foreign body identified. Several tiny calcific densities are seen along the dorsal aspect of the carpal-metacarpal joints on the lateral view. These are suspicious for tiny avulsion fracture fragments, and are of indeterminate age. Old fracture deformity of the distal phalanx of the little finger is seen, with flexion  deformity. IMPRESSION: 1. Tiny calcific densities along the dorsal aspect of the carpal-metacarpal joints, suspicious for tiny avulsion fracture fragments, but of indeterminate age radiographically. 2. Soft tissue swelling and subcutaneous emphysema along the dorsal aspect of the metacarpals. No radiopaque foreign body identified. Electronically Signed   By: JoMarlaine Hind.D.   On: 04/29/2019 20:09   MR HAND LEFT W WO CONTRAST  Result Date: 05/09/2019 CLINICAL DATA:  Left hand pain and swelling since a dog bite 6 days ago. EXAM: MRI OF THE LEFT HAND WITHOUT AND WITH CONTRAST TECHNIQUE: Multiplanar, multisequence MR imaging of the left hand was performed  before and after the administration of intravenous contrast. CONTRAST:  7 mL GADAVIST IV SOLN COMPARISON:  Plain films left hand today. FINDINGS: Bones/Joint/Cartilage There is mild marrow edema and enhancement in the dorsal margins of the distal capitate and trapezoid bones. Bone marrow signal is otherwise normal. No joint effusion is identified. Ligaments Intact. Muscles and Tendons There is fluid with edema and enhancement in the compartment 2, 3 and 4 extensor tendons, worst in compartment 4 where there is a large volume of rim enhancing fluid about the tendons at the level of the articulation of the capitate and third metacarpal. Soft tissues Intense subcutaneous edema and enhancement are seen about the dorsum of the hand. There is a rim enhancing fluid collection in the subcutaneous tissues over the dorsum of the hand which measures 4.7 cm transverse by 0.7 cm AP by approximately 6 cm craniocaudal. Craniocaudal extension is most notable over the third and fourth metacarpals. IMPRESSION: Cellulitis of the dorsum of the left hand with a subcutaneous abscess over the metacarpals as described above. Findings consistent with septic tenosynovitis of the compartment 2, 3 and 4 extensor tendons, worst in compartment 4. Marrow edema and enhancement the dorsal  aspects of the capitate and trapezoid bones most consistent with osteomyelitis. Electronically Signed   By: Inge Rise M.D.   On: 05/09/2019 11:35   DG Hand Complete Left  Result Date: 05/09/2019 CLINICAL DATA:  Wound infection after dog bite 6 days ago EXAM: LEFT HAND - COMPLETE 3+ VIEW COMPARISON:  04/29/2019 FINDINGS: Again seen are multiple tiny bony fragments along the dorsal aspect of the hand at the level of the carpometacarpal joints. Marked soft tissue swelling of the dorsum of the hand, progressed from prior. Previously seen soft tissue air has resolved. No cortical destruction or periostitis. No new fractures. No malalignment. IMPRESSION: 1. Marked soft tissue swelling of the dorsum of the hand, progressed from prior. 2. No radiographic evidence of osteomyelitis. 3. Tiny fracture fragments again noted along the dorsal aspect of the CMC joints. Electronically Signed   By: Davina Poke D.O.   On: 05/09/2019 10:16   [place] EKG SITE RITE  Result Date: 05/11/2019 If Site Rite image not attached, placement could not be confirmed due to current cardiac rhythm.   Microbiology: Recent Results (from the past 240 hour(s))  Respiratory Panel by RT PCR (Flu A&B, Covid) - Nasopharyngeal Swab     Status: None   Collection Time: 05/09/19 12:49 PM   Specimen: Nasopharyngeal Swab  Result Value Ref Range Status   SARS Coronavirus 2 by RT PCR NEGATIVE NEGATIVE Final    Comment: (NOTE) SARS-CoV-2 target nucleic acids are NOT DETECTED. The SARS-CoV-2 RNA is generally detectable in upper respiratoy specimens during the acute phase of infection. The lowest concentration of SARS-CoV-2 viral copies this assay can detect is 131 copies/mL. A negative result does not preclude SARS-Cov-2 infection and should not be used as the sole basis for treatment or other David Galloway management decisions. A negative result may occur with  improper specimen collection/handling, submission of specimen other than  nasopharyngeal swab, presence of viral mutation(s) within the areas targeted by this assay, and inadequate number of viral copies (<131 copies/mL). A negative result must be combined with clinical observations, David Galloway history, and epidemiological information. The expected result is Negative. Fact Sheet for Patients:  PinkCheek.be Fact Sheet for Healthcare Providers:  GravelBags.it This test is not yet ap proved or cleared by the [place] FDA and  has been authorized for detection  and/or diagnosis of SARS-CoV-2 by FDA under an Emergency Use Authorization (EUA). This EUA will remain  in effect (meaning this test can be used) for the duration of the COVID-19 declaration under Section 564(b)(1) of the Act, 21 U.S.C. section 360bbb-3(b)(1), unless the authorization is terminated or revoked sooner.    Influenza A by PCR NEGATIVE NEGATIVE Final   Influenza B by PCR NEGATIVE NEGATIVE Final    Comment: (NOTE) The Xpert Xpress SARS-CoV-2/FLU/RSV assay is intended as an aid in  the diagnosis of influenza from Nasopharyngeal swab specimens and  should not be used as a sole basis for treatment. Nasal washings and  aspirates are unacceptable for Xpert Xpress SARS-CoV-2/FLU/RSV  testing. Fact Sheet for Patients: PinkCheek.be Fact Sheet for Healthcare Providers: GravelBags.it This test is not yet approved or cleared by the [place] FDA and  has been authorized for detection and/or diagnosis of SARS-CoV-2 by  FDA under an Emergency Use Authorization (EUA). This EUA will remain  in effect (meaning this test can be used) for the duration of the  Covid-19 declaration under Section 564(b)(1) of the Act, 21  U.S.C. section 360bbb-3(b)(1), unless the authorization is  terminated or revoked. Performed at [facility], [place] [address] [place], [place] [address]   Culture,  blood (Routine X 2) w Reflex to ID Panel     Status: None   Collection Time: 05/09/19  2:33 PM   Specimen: BLOOD  Result Value Ref Range Status   Specimen Description BLOOD LEFT ANTECUBITAL  Final   Special Requests   Final    BOTTLES DRAWN AEROBIC AND ANAEROBIC Blood Culture results may not be optimal due to an excessive volume of blood received in culture bottles   Culture   Final    NO GROWTH 5 DAYS Performed at [facility], [place] [address], [place], [place] [address]    Report Status 05/14/2019 FINAL  Final  Culture, blood (Routine X 2) w Reflex to ID Panel     Status: None   Collection Time: 05/09/19  2:44 PM   Specimen: BLOOD  Result Value Ref Range Status   Specimen Description BLOOD RIGHT ANTECUBITAL  Final   Special Requests   Final    BOTTLES DRAWN AEROBIC AND ANAEROBIC Blood Culture results may not be optimal due to an inadequate volume of blood received in culture bottles   Culture   Final    NO GROWTH 5 DAYS Performed at [facility], [place] [address], [place], [place] [address]    Report Status 05/14/2019 FINAL  Final  Surgical PCR screen     Status: None   Collection Time: 05/09/19  3:50 PM   Specimen: Nasal Mucosa; Nasal Swab  Result Value Ref Range Status   MRSA, PCR NEGATIVE NEGATIVE Final   Staphylococcus aureus NEGATIVE NEGATIVE Final    Comment: (NOTE) The Xpert SA Assay (FDA approved for NASAL specimens in patients 5 years of age and older), is one component of a comprehensive surveillance program. It is not intended to diagnose infection nor to guide or monitor treatment. Performed at [facility], [place] [address] [place], [place] [address]   Aerobic/Anaerobic Culture (surgical/deep wound)     Status: None   Collection Time: 05/09/19  5:21 PM   Specimen: Wound  Result Value Ref Range Status   Specimen Description HAND  Final   Special Requests LEFT SAMPLE A  Final   Gram Stain   Final    RARE  WBC PRESENT, PREDOMINANTLY PMN NO  ORGANISMS SEEN    Culture   Final    No growth aerobically or anaerobically. Performed at [facility], [place] [address], [place], [place] [address]    Report Status 05/14/2019 FINAL  Final  Aerobic/Anaerobic Culture (surgical/deep wound)     Status: None   Collection Time: 05/09/19  5:45 PM   Specimen: Wound  Result Value Ref Range Status   Specimen Description WOUND  Final   Special Requests LEFT TRAPEZOID SAMPLE B  Final   Gram Stain   Final    RARE WBC PRESENT, PREDOMINANTLY PMN NO ORGANISMS SEEN    Culture   Final    RARE STAPHYLOCOCCUS AUREUS NO ANAEROBES ISOLATED Performed at [facility], [place] [address], [place], [place] [address]    Report Status 05/14/2019 FINAL  Final   Organism ID, Bacteria STAPHYLOCOCCUS AUREUS  Final      Susceptibility   Staphylococcus aureus - MIC*    CIPROFLOXACIN <=0.5 SENSITIVE Sensitive     ERYTHROMYCIN <=0.25 SENSITIVE Sensitive     GENTAMICIN <=0.5 SENSITIVE Sensitive     OXACILLIN <=0.25 SENSITIVE Sensitive     TETRACYCLINE <=1 SENSITIVE Sensitive     VANCOMYCIN <=0.5 SENSITIVE Sensitive     TRIMETH/SULFA <=10 SENSITIVE Sensitive     CLINDAMYCIN <=0.25 SENSITIVE Sensitive     RIFAMPIN <=0.5 SENSITIVE Sensitive     Inducible Clindamycin NEGATIVE Sensitive     * RARE STAPHYLOCOCCUS AUREUS     Labs: Basic Metabolic Panel: Recent Labs  Lab 05/08/19 2337 05/09/19 1444 05/10/19 0219 05/12/19 0242 05/13/19 0222 05/14/19 0441 05/15/19 0413  NA   < >  --  139 141 140 139 141  K   < >  --  4.3 3.6 3.9 4.1 4.1  CL   < >  --  105 103 104 100 103  CO2   < >  --  23 27 27 28 29   GLUCOSE   < >  --  134* 113* 118* 101* 119*  BUN   < >  --  9 11 13 13 13   CREATININE   < >  --  1.01 1.03 1.00 1.01 1.20  CALCIUM   < >  --  8.4* 8.8* 9.0 9.1 9.0  MG  --  2.0  --  2.1 2.0 2.1 2.1  PHOS  --   --   --  4.9* 4.3 4.7* 4.8*   < > = values in this interval not displayed.   Liver Function Tests: Recent Labs  Lab 05/10/19 0219  05/12/19 0242 05/13/19 0222 05/14/19 0441 05/15/19 0413  AST 12* 13* 13* 16 17  ALT 15 13 14 16 16   ALKPHOS 47 53 48 53 58  BILITOT 0.5 0.5 0.5 0.5 0.4  PROT 5.8* 5.9* 6.2* 6.5 6.5  ALBUMIN 2.9* 3.1* 3.2* 3.4* 3.5   No results for input(s): LIPASE, AMYLASE in the last 168 hours. No results for input(s): AMMONIA in the last 168 hours. CBC: Recent Labs  Lab 05/08/19 2337 05/08/19 2337 05/10/19 0219 05/12/19 0242 05/13/19 0222 05/14/19 0441 05/15/19 0413  WBC 9.3   < > 11.6* 9.4 9.9 9.6 10.5  NEUTROABS 5.6  --   --  5.2 5.8 5.4 6.1  HGB 13.4   < > 12.3* 13.1 12.7* 13.8 14.0  HCT 37.3*   < > 34.8* 37.3* 35.4* 38.7* 39.6  MCV 79.5*   < > 79.5* 78.9* 79.0* 78.3* 79.4*  PLT 393   < >  374 373 366 392 390   < > = values in this interval not displayed.   Cardiac Enzymes: No results for input(s): CKTOTAL, CKMB, CKMBINDEX, TROPONINI in the last 168 hours. BNP: BNP (last 3 results) No results for input(s): BNP in the last 8760 hours.  ProBNP (last 3 results) No results for input(s): PROBNP in the last 8760 hours.  CBG: No results for input(s): GLUCAP in the last 168 hours.     Signed:  Dia Crawford, MD [facility] [phone] pager

## 2019-05-31 ENCOUNTER — Inpatient Hospital Stay: Payer: Self-pay | Admitting: Internal Medicine

## 2019-06-04 ENCOUNTER — Encounter: Payer: Self-pay | Admitting: Internal Medicine

## 2020-02-29 ENCOUNTER — Ambulatory Visit: Payer: MEDICAID | Attending: Family

## 2020-02-29 DIAGNOSIS — Z23 Encounter for immunization: Secondary | ICD-10-CM

## 2020-06-25 NOTE — Progress Notes (Signed)
   Covid-19 Vaccination Clinic  Name:  David Galloway    MRN: [id number] DOB: Mar 17, 1975  06/25/2020  [name] was observed post Covid-19 immunization for 15 minutes without incident. He was provided with Vaccine Information Sheet and instruction to access the V-Safe system.   [name] was instructed to call 911 with any severe reactions post vaccine: Marland Kitchen Difficulty breathing  . Swelling of face and throat  . A fast heartbeat  . A bad rash all over body  . Dizziness and weakness   Immunizations Administered    Name Date Dose VIS Date Route   Moderna COVID-19 Vaccine 02/29/2020  1:45 PM 0.5 mL 12/12/2019 Intramuscular   Manufacturer: Moderna   Lot: [id number]   NDC: 21194-174-08

## 2021-01-11 IMAGING — MR MR [PERSON_NAME]*[PERSON_NAME]* WO/W CM
7 of 9 series · 31 of 40 positions shown · IV contrast (gadavist)
Comparison: Plain films left hand today.

CLINICAL DATA: Left hand pain and swelling since a dog bite 6 days
ago.

EXAM:
MRI OF THE LEFT HAND WITHOUT AND WITH CONTRAST
TECHNIQUE: Multiplanar, multisequence MR imaging of the left hand was performed
before and after the administration of intravenous contrast.
CONTRAST:  7 mL GADAVIST IV SOLN

[Series 5: T1 · sagittal · left · 3.0mm · 0.33mm/px · 4 of 20 slices shown (1 of 2)]
[im 1/20]
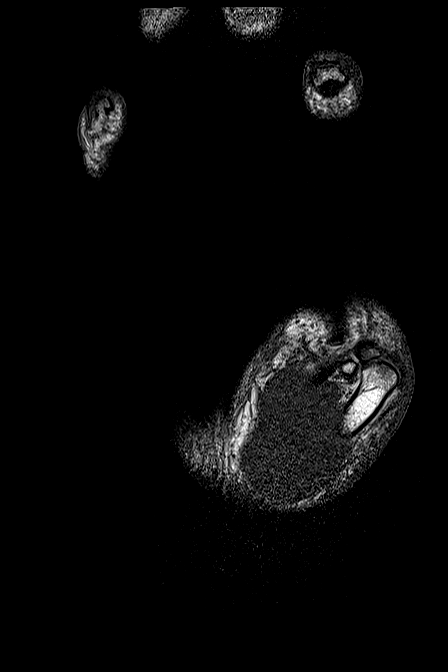
[im 7/20]
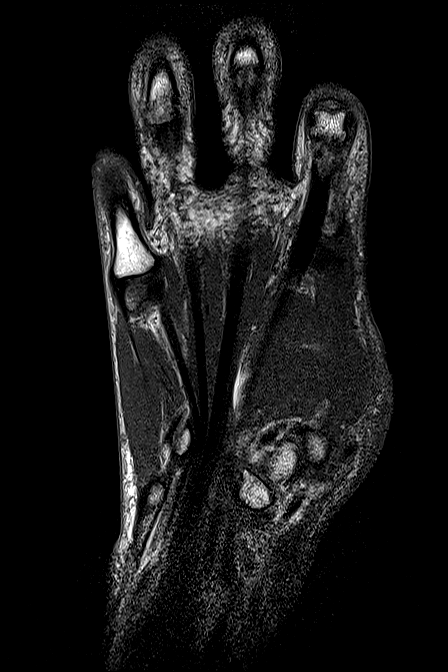
[im 13/20]
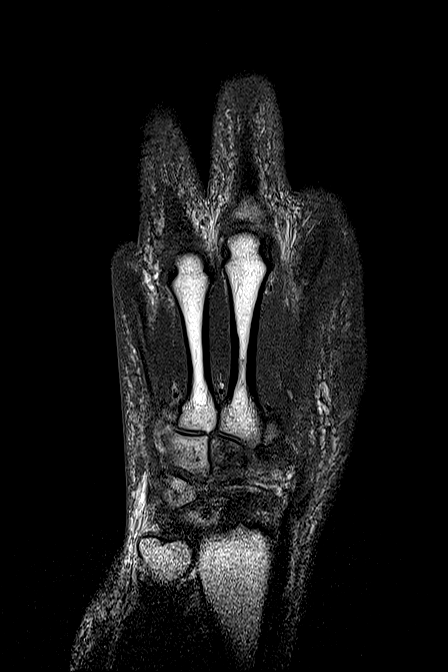
[im 20/20]
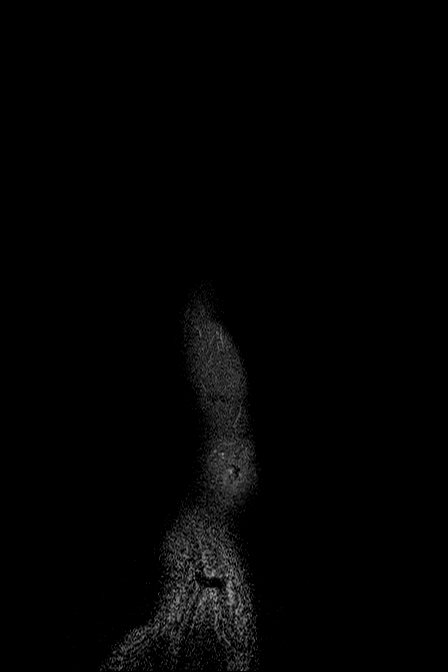

[Series 6: T2 fat-sat · axial · left · 4.0mm · 0.44mm/px · z∈[-99,+42]mm · 5 of 36 slices shown]
[im 1/36]
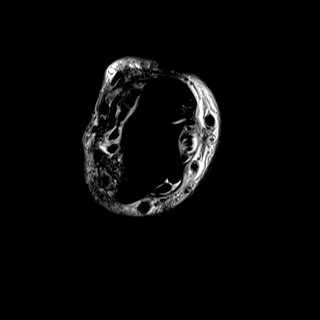
[im 9/36]
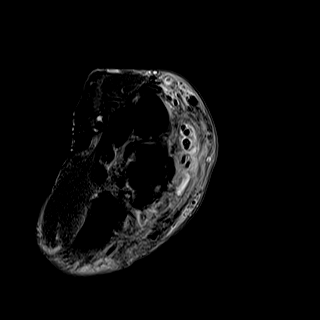
[im 18/36]
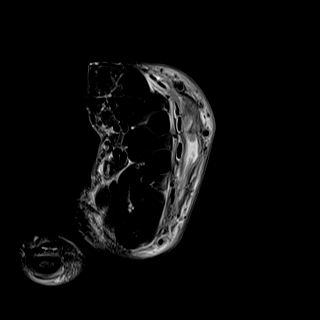
[im 27/36]
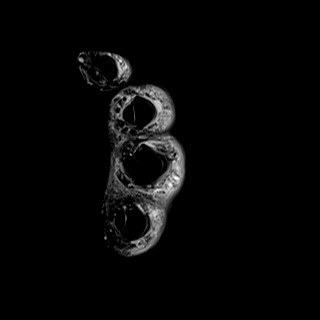
[im 36/36]
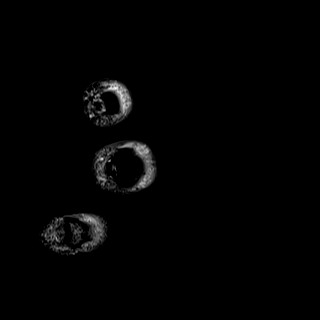

[Series 7: T1 fat-sat · axial · non-contrast · left · 4.0mm · 0.55mm/px · z∈[-105,+52]mm · 5 of 40 slices shown (1 of 3)]
[im 1/40]
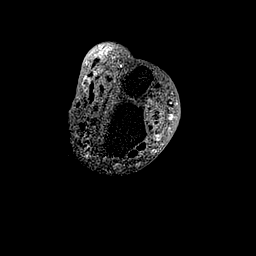
[im 10/40]
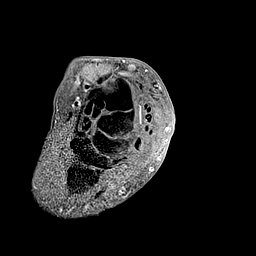
[im 20/40]
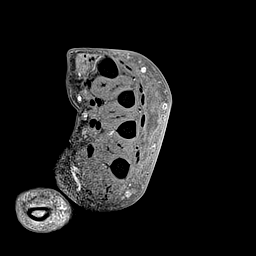
[im 30/40]
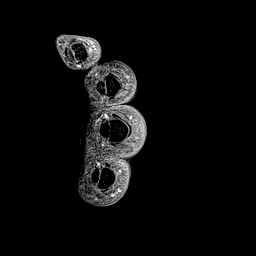
[im 40/40]
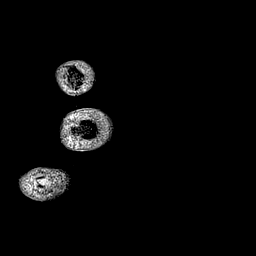

[Series 8: T1 fat-sat · axial · non-contrast · left · 4.0mm · 0.55mm/px · z∈[-105,+52]mm · 5 of 40 slices shown (2 of 3)]
[im 1/40]
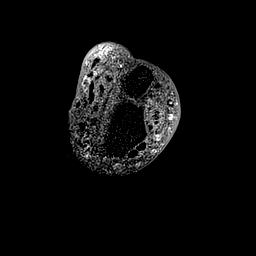
[im 10/40]
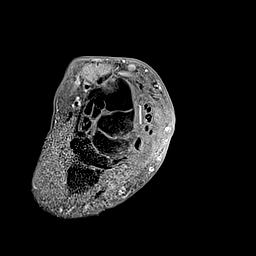
[im 20/40]
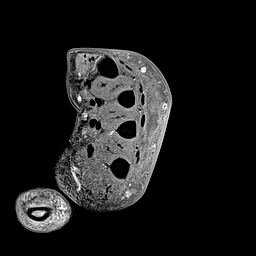
[im 30/40]
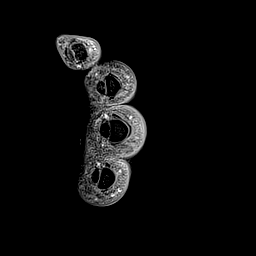
[im 40/40]
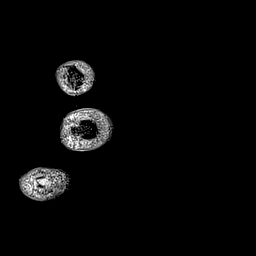

[Series 9: T1 · axial · left · 4.0mm · 0.31mm/px · z∈[-105,+52]mm · 5 of 40 slices shown (2 of 2)]
[im 1/40]
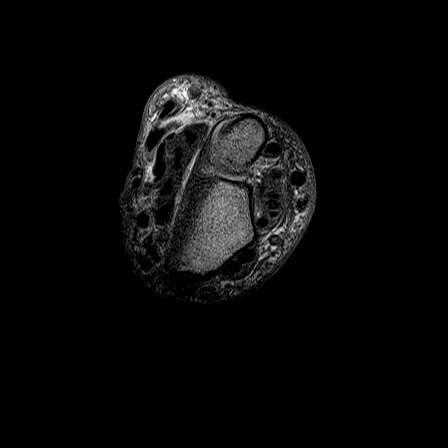
[im 10/40]
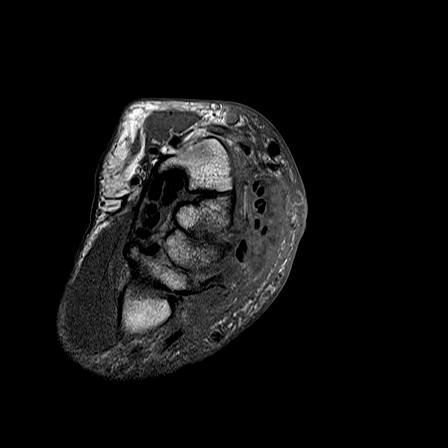
[im 20/40]
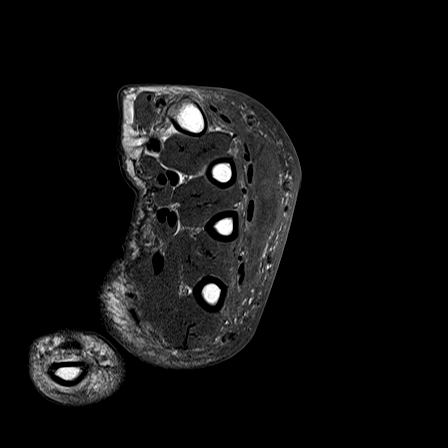
[im 30/40]
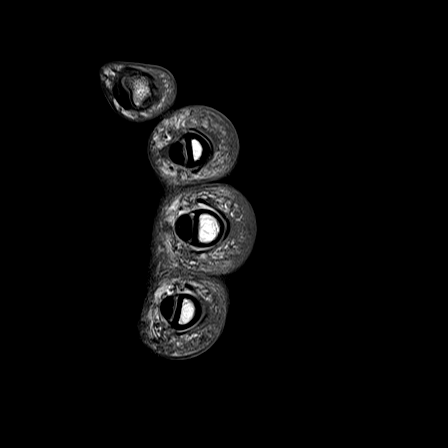
[im 40/40]
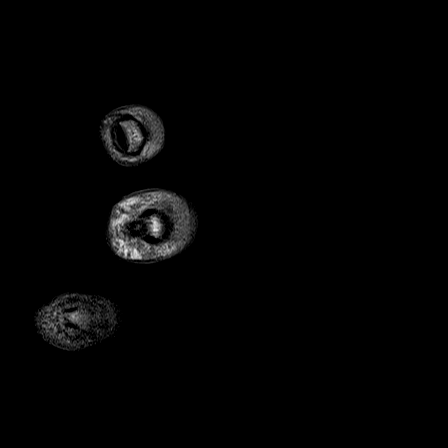

[Series 10: PD fat-sat · coronal · left · 3.0mm · 0.33mm/px · 5 of 36 slices shown]
[im 1/36]
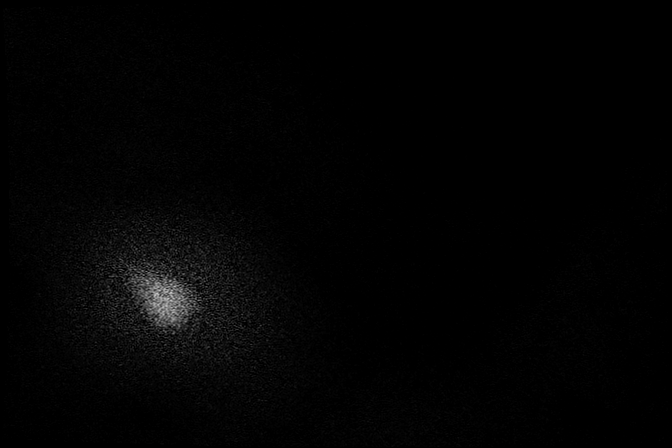
[im 9/36]
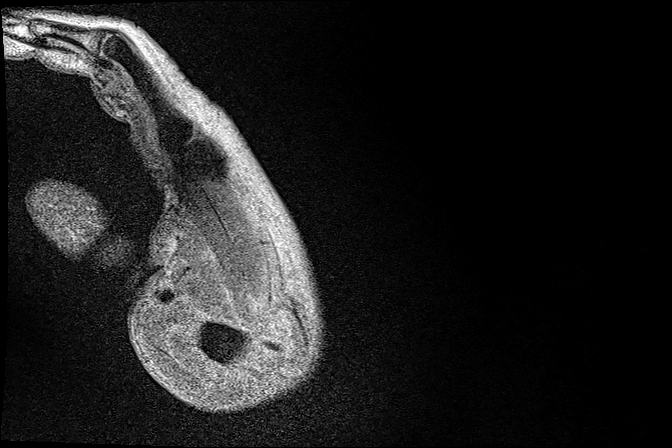
[im 18/36]
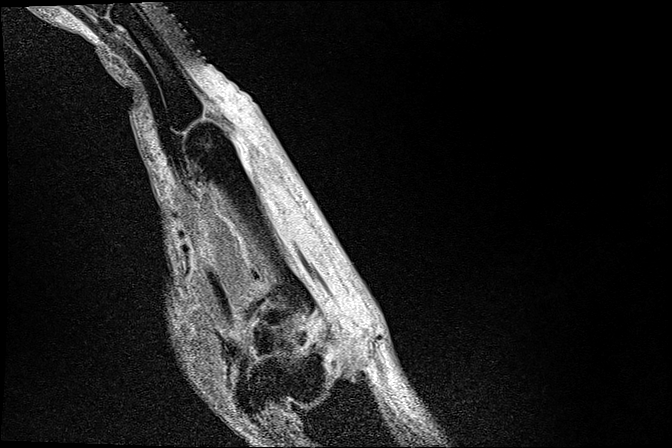
[im 27/36]
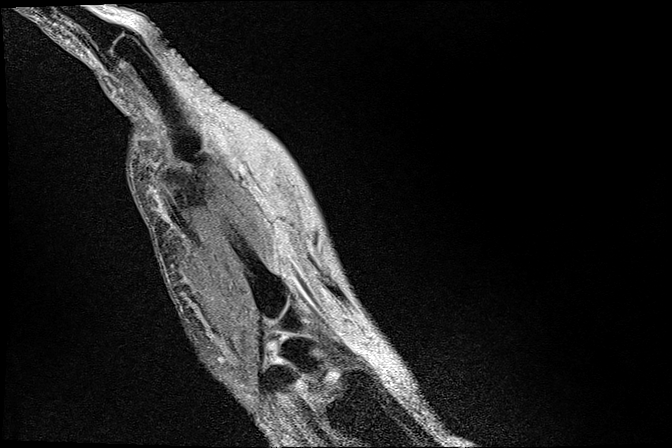
[im 36/36]
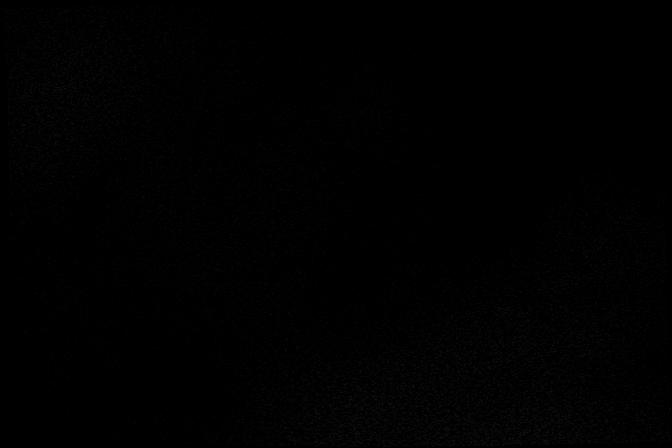

[Series 13: T1 fat-sat · axial · non-contrast · left · 4.0mm · 0.55mm/px · z∈[-105,-69]mm · 2 of 40 slices shown (3 of 3)]
[im 1/40]
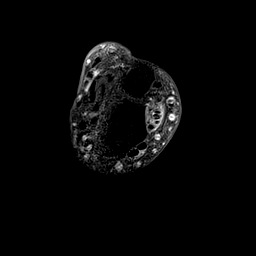
[im 10/40]
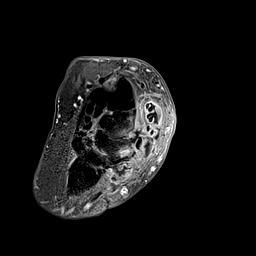

[31 of 40 positions shown; findings below may reference images not displayed]

FINDINGS: Bones/Joint/Cartilage

There is mild marrow edema and enhancement in the dorsal margins of
the distal capitate and trapezoid bones. Bone marrow signal is
otherwise normal. No joint effusion is identified.

Ligaments

Intact.

Muscles and Tendons

There is fluid with edema and enhancement in the compartment 2, 3
and 4 extensor tendons, worst in compartment 4 where there is a
large volume of rim enhancing fluid about the tendons at the level
of the articulation of the capitate and third metacarpal.

Soft tissues

Intense subcutaneous edema and enhancement are seen about the dorsum
of the hand. There is a rim enhancing fluid collection in the
subcutaneous tissues over the dorsum of the hand which measures
cm transverse by 0.7 cm AP by approximately 6 cm craniocaudal.
Craniocaudal extension is most notable over the third and fourth
metacarpals.
IMPRESSION: Cellulitis of the dorsum of the left hand with a subcutaneous
abscess over the metacarpals as described above.

Findings consistent with septic tenosynovitis of the compartment 2,
3 and 4 extensor tendons, worst in compartment 4.

Marrow edema and enhancement the dorsal aspects of the capitate and
trapezoid bones most consistent with osteomyelitis.

## 2024-03-27 ENCOUNTER — Encounter (HOSPITAL_COMMUNITY): Payer: Self-pay | Admitting: Emergency Medicine

## 2024-03-27 ENCOUNTER — Other Ambulatory Visit: Payer: Self-pay

## 2024-03-27 ENCOUNTER — Emergency Department (HOSPITAL_COMMUNITY)
Admission: EM | Admit: 2024-03-27 | Discharge: 2024-03-27 | Disposition: A | Discharge status: Home / Self Care | Payer: Self-pay | Attending: Emergency Medicine | Admitting: Emergency Medicine

## 2024-03-27 DIAGNOSIS — H60391 Other infective otitis externa, right ear: Secondary | ICD-10-CM

## 2024-03-27 DIAGNOSIS — H6691 Otitis media, unspecified, right ear: Secondary | ICD-10-CM | POA: Insufficient documentation

## 2024-03-27 DIAGNOSIS — Z79899 Other long term (current) drug therapy: Secondary | ICD-10-CM | POA: Insufficient documentation

## 2024-03-27 MED ORDER — DOXYCYCLINE HYCLATE 100 MG PO CAPS: 100.0000 mg | ORAL_CAPSULE | Freq: Two times a day (BID) | ORAL | 0 refills | Status: AC

## 2024-03-27 NOTE — ED Triage Notes (Signed)
 Patient reports earring back stuck in left ear x 2 days.  Patient denies drainage, pain, fevers. Patient UTD on tetanus.
